# Patient Record
Sex: Female | Born: 1950 | Hispanic: Yes | Marital: Single | Smoking: Never smoker
Health system: Southern US, Community
[De-identification: ages and names within clinical notes are randomized; demographics above are authoritative.]

## PROBLEM LIST (undated history)

## (undated) DIAGNOSIS — J309 Allergic rhinitis, unspecified: Secondary | ICD-10-CM

## (undated) DIAGNOSIS — I10 Essential (primary) hypertension: Secondary | ICD-10-CM

## (undated) DIAGNOSIS — M1990 Unspecified osteoarthritis, unspecified site: Secondary | ICD-10-CM

## (undated) DIAGNOSIS — M109 Gout, unspecified: Secondary | ICD-10-CM

## (undated) DIAGNOSIS — IMO0001 Reserved for inherently not codable concepts without codable children: Secondary | ICD-10-CM

## (undated) DIAGNOSIS — E785 Hyperlipidemia, unspecified: Secondary | ICD-10-CM

## (undated) DIAGNOSIS — G4700 Insomnia, unspecified: Secondary | ICD-10-CM

## (undated) HISTORY — DX: Unspecified osteoarthritis, unspecified site: M19.90

## (undated) HISTORY — DX: Gout, unspecified: M10.9

## (undated) HISTORY — PX: ABDOMINAL HYSTERECTOMY: SHX81

## (undated) HISTORY — DX: Insomnia, unspecified: G47.00

## (undated) HISTORY — DX: Allergic rhinitis, unspecified: J30.9

## (undated) HISTORY — DX: Hyperlipidemia, unspecified: E78.5

---

## 2006-01-30 ENCOUNTER — Ambulatory Visit: Payer: Self-pay | Admitting: Internal Medicine

## 2007-09-06 ENCOUNTER — Ambulatory Visit: Payer: Self-pay | Admitting: Family Medicine

## 2008-04-01 ENCOUNTER — Ambulatory Visit: Payer: Self-pay | Admitting: Family Medicine

## 2009-09-01 ENCOUNTER — Ambulatory Visit: Payer: Self-pay

## 2011-01-23 ENCOUNTER — Ambulatory Visit: Payer: Self-pay | Admitting: Family Medicine

## 2012-02-27 ENCOUNTER — Ambulatory Visit: Payer: Self-pay | Admitting: Family Medicine

## 2013-06-02 ENCOUNTER — Ambulatory Visit: Payer: Self-pay

## 2014-02-23 DIAGNOSIS — M549 Dorsalgia, unspecified: Secondary | ICD-10-CM | POA: Insufficient documentation

## 2014-02-23 DIAGNOSIS — M1990 Unspecified osteoarthritis, unspecified site: Secondary | ICD-10-CM | POA: Insufficient documentation

## 2014-02-23 DIAGNOSIS — I10 Essential (primary) hypertension: Secondary | ICD-10-CM | POA: Insufficient documentation

## 2014-03-13 ENCOUNTER — Ambulatory Visit: Payer: Self-pay | Admitting: Orthopedic Surgery

## 2014-08-11 DIAGNOSIS — E781 Pure hyperglyceridemia: Secondary | ICD-10-CM | POA: Insufficient documentation

## 2014-08-11 DIAGNOSIS — E785 Hyperlipidemia, unspecified: Secondary | ICD-10-CM | POA: Insufficient documentation

## 2014-08-17 ENCOUNTER — Other Ambulatory Visit: Payer: Self-pay | Admitting: Family Medicine

## 2014-08-17 DIAGNOSIS — Z1231 Encounter for screening mammogram for malignant neoplasm of breast: Secondary | ICD-10-CM

## 2014-08-25 DIAGNOSIS — R7303 Prediabetes: Secondary | ICD-10-CM | POA: Insufficient documentation

## 2014-10-30 ENCOUNTER — Encounter: Payer: Self-pay | Admitting: *Deleted

## 2014-11-02 ENCOUNTER — Ambulatory Visit
Admission: RE | Admit: 2014-11-02 | Discharge: 2014-11-02 | Disposition: A | Discharge status: Home Health | Payer: Medicare Other | Source: Ambulatory Visit | Attending: Gastroenterology | Admitting: Gastroenterology

## 2014-11-02 ENCOUNTER — Ambulatory Visit: Payer: Medicare Other | Admitting: Anesthesiology

## 2014-11-02 ENCOUNTER — Encounter
Admission: RE | Disposition: A | Discharge status: Home Health | Payer: Self-pay | Source: Ambulatory Visit | Attending: Gastroenterology

## 2014-11-02 DIAGNOSIS — Z1211 Encounter for screening for malignant neoplasm of colon: Secondary | ICD-10-CM | POA: Insufficient documentation

## 2014-11-02 DIAGNOSIS — Z79899 Other long term (current) drug therapy: Secondary | ICD-10-CM | POA: Insufficient documentation

## 2014-11-02 DIAGNOSIS — Z7982 Long term (current) use of aspirin: Secondary | ICD-10-CM | POA: Insufficient documentation

## 2014-11-02 DIAGNOSIS — I10 Essential (primary) hypertension: Secondary | ICD-10-CM | POA: Diagnosis not present

## 2014-11-02 DIAGNOSIS — D120 Benign neoplasm of cecum: Secondary | ICD-10-CM | POA: Diagnosis not present

## 2014-11-02 HISTORY — PX: COLONOSCOPY WITH PROPOFOL: SHX5780

## 2014-11-02 HISTORY — DX: Essential (primary) hypertension: I10

## 2014-11-02 HISTORY — DX: Reserved for inherently not codable concepts without codable children: IMO0001

## 2014-11-02 SURGERY — COLONOSCOPY WITH PROPOFOL
Anesthesia: General

## 2014-11-02 MED ORDER — FENTANYL CITRATE (PF) 100 MCG/2ML IJ SOLN
INTRAMUSCULAR | Status: DC | PRN
  Administered 2014-11-02: 50 ug via INTRAVENOUS

## 2014-11-02 MED ORDER — SODIUM CHLORIDE 0.9 % IV SOLN
INTRAVENOUS | Status: DC
  Administered 2014-11-02: 1000 mL via INTRAVENOUS

## 2014-11-02 MED ORDER — PROPOFOL INFUSION 10 MG/ML OPTIME
INTRAVENOUS | Status: DC | PRN
  Administered 2014-11-02: 75 ug/kg/min via INTRAVENOUS

## 2014-11-02 MED ORDER — PROPOFOL 10 MG/ML IV BOLUS
INTRAVENOUS | Status: DC | PRN
  Administered 2014-11-02 (×2): 25 mg via INTRAVENOUS
  Administered 2014-11-02: 50 mg via INTRAVENOUS

## 2014-11-02 NOTE — Op Note (Signed)
[facility] Gastroenterology Patient Name: Linda Roy Procedure Date: 11/02/2014 11:22 AM MRN: [id number] Account #: [id number] Date of Birth: [birth year] Admit Type: Outpatient Age: 64 Room: [facility] ENDO ROOM 4 Gender: Female Note Status: Finalized Procedure:         Colonoscopy Indications:       Screening for colorectal malignant neoplasm Providers:         [name]. Candace Cruise, MD Referring MD:      Perrin Maltese, MD (Referring MD) Medicines:         Monitored Anesthesia Care Complications:     No immediate complications. Procedure:         Pre-Anesthesia Assessment:                    - Prior to the procedure, a History and Physical was                     performed, and patient medications, allergies and                     sensitivities were reviewed. The patient's tolerance of                     previous anesthesia was reviewed.                    - The risks and benefits of the procedure and the sedation                     options and risks were discussed with the patient. All                     questions were answered and informed consent was obtained.                    - After reviewing the risks and benefits, the patient was                     deemed in satisfactory condition to undergo the procedure.                    After obtaining informed consent, the colonoscope was                     passed under direct vision. Throughout the procedure, the                     patient's blood pressure, pulse, and oxygen saturations                     were monitored continuously. The Colonoscope was                     introduced through the anus and advanced to the the cecum,                     identified by appendiceal orifice and ileocecal valve. The                     colonoscopy was performed without difficulty. The patient                     tolerated the procedure well. The quality of the bowel  preparation was fair. Findings:    A diminutive polyp was found in the cecum. The polyp was sessile. The       polyp was removed with a jumbo cold forceps. Resection and retrieval       were complete.      The exam was otherwise without abnormality. Impression:        - One diminutive polyp in the cecum. Resected and                     retrieved.                    - The examination was otherwise normal. Recommendation:    - Discharge patient to home.                    - Await pathology results.                    - Repeat colonoscopy in 5-10 years for surveillance based                     on pathology results.                    - The findings and recommendations were discussed with the                     patient. Procedure Code(s): --- Professional ---                    [phone], Colonoscopy, flexible; with biopsy, single or                     multiple Diagnosis Code(s): --- Professional ---                    Z12.11, Encounter for screening for malignant neoplasm of                     colon                    D12.0, Benign neoplasm of cecum CPT copyright 2014 American Medical Association. All rights reserved. The codes documented in this report are preliminary and upon coder review may  be revised to meet current compliance requirements. Hulen Luster, MD 11/02/2014 12:01:47 PM This report has been signed electronically. Number of Addenda: 0 Note Initiated On: 11/02/2014 11:22 AM Scope Withdrawal Time: 0 hours 7 minutes 1 second  Total Procedure Duration: 0 hours 11 minutes 5 seconds       [facility]

## 2014-11-02 NOTE — Anesthesia Postprocedure Evaluation (Signed)
  Anesthesia Post-op Note  Patient: Linda Roy  Procedure(s) Performed: Procedure(s): COLONOSCOPY WITH PROPOFOL (N/A)  Anesthesia type:General  Patient location: PACU  Post pain: Pain level controlled  Post assessment: Post-op Vital signs reviewed, Patient's Cardiovascular Status Stable, Respiratory Function Stable, Patent Airway and No signs of Nausea or vomiting  Post vital signs: Reviewed and stable  Last Vitals:  Filed Vitals:   11/02/14 1236  BP: 140/68  Pulse: 55  Temp:   Resp: 16    Level of consciousness: awake, alert  and patient cooperative  Complications: No apparent anesthesia complications

## 2014-11-02 NOTE — H&P (Signed)
    Primary Care Physician:  Perrin Maltese, MD Primary Gastroenterologist:  Dr. Candace Cruise  Pre-Procedure History & Physical: HPI:  Linda Roy is a 64 y.o. female is here for an colonoscopy.   Past Medical History  Diagnosis Date  . Hypertension   . Shortness of breath dyspnea     No past surgical history on file.  Prior to Admission medications   Medication Sig Start Date End Date Taking? Authorizing Provider  aspirin 81 MG tablet Take 81 mg by mouth daily.   Yes Historical Provider, MD  budesonide-formoterol (SYMBICORT) 160-4.5 MCG/ACT inhaler Inhale 2 puffs into the lungs 2 (two) times daily.   Yes Historical Provider, MD  lisinopril (PRINIVIL,ZESTRIL) 10 MG tablet Take 10 mg by mouth daily.   Yes Historical Provider, MD    Allergies as of 10/06/2014  . (Not on File)    No family history on file.  History   Social History  . Marital Status: Single    Spouse Name: N/A  . Number of Children: N/A  . Years of Education: N/A   Occupational History  . Not on file.   Social History Main Topics  . Smoking status: Not on file  . Smokeless tobacco: Not on file  . Alcohol Use: Not on file  . Drug Use: Not on file  . Sexual Activity: Not on file   Other Topics Concern  . Not on file   Social History Narrative  . No narrative on file    Review of Systems: See HPI, otherwise negative ROS  Physical Exam: There were no vitals taken for this visit. General:   Alert,  pleasant and cooperative in NAD Head:  Normocephalic and atraumatic. Neck:  Supple; no masses or thyromegaly. Lungs:  Clear throughout to auscultation.    Heart:  Regular rate and rhythm. Abdomen:  Soft, nontender and nondistended. Normal bowel sounds, without guarding, and without rebound.   Neurologic:  Alert and  oriented x4;  grossly normal neurologically.  Impression/Plan: Linda Roy is here for an colonoscopy to be performed for screening.  Risks, benefits, limitations, and  alternatives regarding colonscopy have been reviewed with the patient.  Questions have been answered.  All parties agreeable.   Linda Roy, [name], MD  11/02/2014, 11:04 AM

## 2014-11-02 NOTE — Anesthesia Preprocedure Evaluation (Signed)
Anesthesia Evaluation  Patient identified by MRN, date of birth, ID band Patient awake    Reviewed: Allergy & Precautions, H&P , NPO status , Patient's Chart, lab work & pertinent test results, reviewed documented beta blocker date and time   Airway Mallampati: II  TM Distance: >3 FB Neck ROM: full    Dental no notable dental hx. (+) Teeth Intact   Pulmonary shortness of breath,  breath sounds clear to auscultation  Pulmonary exam normal       Cardiovascular hypertension, Normal cardiovascular examRhythm:regular Rate:Normal     Neuro/Psych negative neurological ROS  negative psych ROS   GI/Hepatic negative GI ROS, Neg liver ROS,   Endo/Other  negative endocrine ROS  Renal/GU negative Renal ROS  negative genitourinary   Musculoskeletal   Abdominal   Peds  Hematology negative hematology ROS (+)   Anesthesia Other Findings Past Medical History:   Hypertension                                                 Shortness of breath dyspnea                                  Reproductive/Obstetrics negative OB ROS                             Anesthesia Physical Anesthesia Plan  ASA: III  Anesthesia Plan: General   Post-op Pain Management:    Induction:   Airway Management Planned:   Additional Equipment:   Intra-op Plan:   Post-operative Plan:   Informed Consent: I have reviewed the patients History and Physical, chart, labs and discussed the procedure including the risks, benefits and alternatives for the proposed anesthesia with the patient or authorized representative who has indicated his/her understanding and acceptance.     Plan Discussed with: Anesthesiologist, CRNA and Surgeon  Anesthesia Plan Comments:         Anesthesia Quick Evaluation

## 2014-11-02 NOTE — Transfer of Care (Signed)
Immediate Anesthesia Transfer of Care Note  Patient: Linda Roy  Procedure(s) Performed: Procedure(s): COLONOSCOPY WITH PROPOFOL (N/A)  Patient Location: PACU and Endoscopy Unit  Anesthesia Type:General  Level of Consciousness: sedated  Airway & Oxygen Therapy: Patient Spontanous Breathing  Post-op Assessment: Report given to RN  Post vital signs: stable  Last Vitals:  Filed Vitals:   11/02/14 1109  BP: 145/64  Pulse: 58  Temp: 36.6 C  Resp: 15    Complications: No apparent anesthesia complications

## 2014-11-04 LAB — SURGICAL PATHOLOGY

## 2014-11-09 ENCOUNTER — Encounter: Payer: Self-pay | Admitting: Gastroenterology

## 2015-01-04 ENCOUNTER — Ambulatory Visit
Admission: RE | Admit: 2015-01-04 | Discharge: 2015-01-04 | Disposition: A | Discharge status: Home / Self Care | Payer: Medicare Other | Source: Ambulatory Visit | Attending: Family Medicine | Admitting: Family Medicine

## 2015-01-04 DIAGNOSIS — Z1231 Encounter for screening mammogram for malignant neoplasm of breast: Secondary | ICD-10-CM | POA: Diagnosis not present

## 2016-03-01 ENCOUNTER — Other Ambulatory Visit: Payer: Self-pay | Admitting: Internal Medicine

## 2016-03-01 DIAGNOSIS — Z1231 Encounter for screening mammogram for malignant neoplasm of breast: Secondary | ICD-10-CM

## 2016-04-12 ENCOUNTER — Ambulatory Visit
Admission: RE | Admit: 2016-04-12 | Discharge: 2016-04-12 | Disposition: A | Discharge status: Home / Self Care | Payer: Medicare Other | Source: Ambulatory Visit | Attending: Internal Medicine | Admitting: Internal Medicine

## 2016-04-12 DIAGNOSIS — Z1231 Encounter for screening mammogram for malignant neoplasm of breast: Secondary | ICD-10-CM | POA: Diagnosis not present

## 2016-04-12 DIAGNOSIS — R928 Other abnormal and inconclusive findings on diagnostic imaging of breast: Secondary | ICD-10-CM | POA: Diagnosis not present

## 2016-04-18 ENCOUNTER — Other Ambulatory Visit: Payer: Self-pay | Admitting: Internal Medicine

## 2016-04-18 DIAGNOSIS — N6310 Unspecified lump in the right breast, unspecified quadrant: Secondary | ICD-10-CM

## 2016-04-18 DIAGNOSIS — R928 Other abnormal and inconclusive findings on diagnostic imaging of breast: Secondary | ICD-10-CM

## 2016-05-12 ENCOUNTER — Ambulatory Visit
Admission: RE | Admit: 2016-05-12 | Discharge: 2016-05-12 | Disposition: A | Discharge status: Home / Self Care | Payer: Medicare Other | Source: Ambulatory Visit | Attending: Internal Medicine | Admitting: Internal Medicine

## 2016-05-12 DIAGNOSIS — N6310 Unspecified lump in the right breast, unspecified quadrant: Secondary | ICD-10-CM | POA: Insufficient documentation

## 2016-05-12 DIAGNOSIS — R928 Other abnormal and inconclusive findings on diagnostic imaging of breast: Secondary | ICD-10-CM | POA: Insufficient documentation

## 2017-06-17 IMAGING — MG MM DIGITAL DIAGNOSTIC UNILAT*R* W/ TOMO W/ CAD
6 series · 6 of 14 positions shown · non-contrast
Comparison: Previous exam(s).

CLINICAL DATA: Screening recall for possible right breast masses.

EXAM:
2D DIGITAL DIAGNOSTIC UNILATERAL RIGHT MAMMOGRAM WITH CAD AND
ADJUNCT TOMO
RIGHT BREAST ULTRASOUND

[R CC synth-2D]
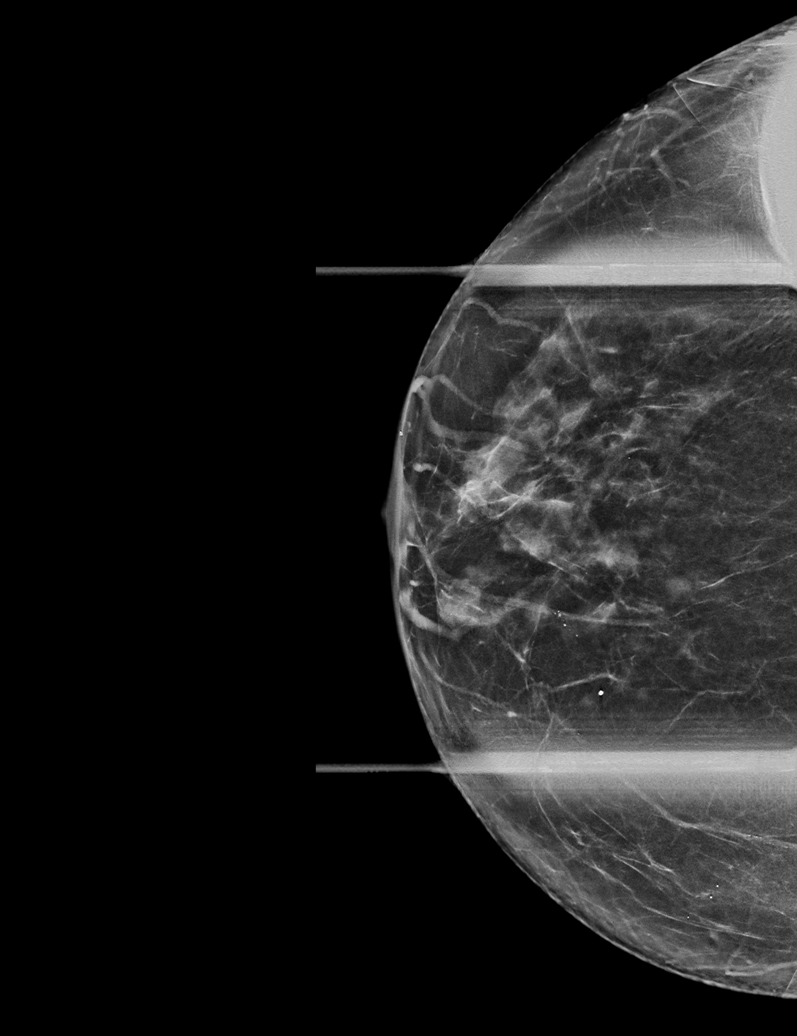

[R MLO synth-2D]
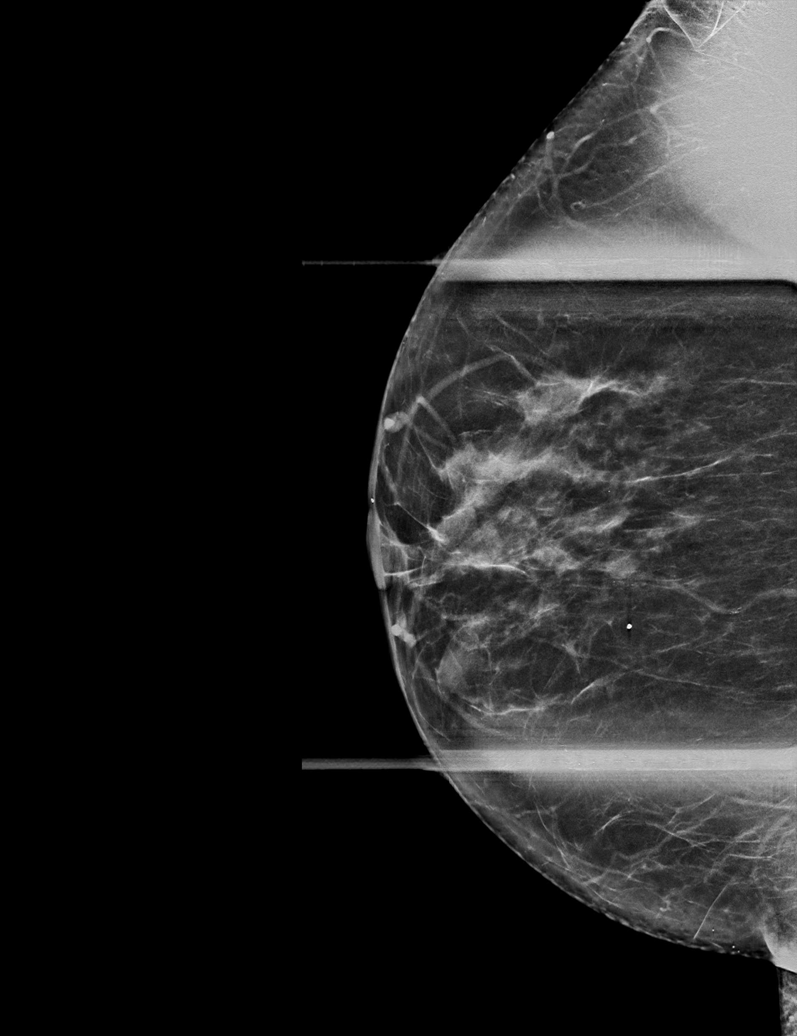

[R MLO]
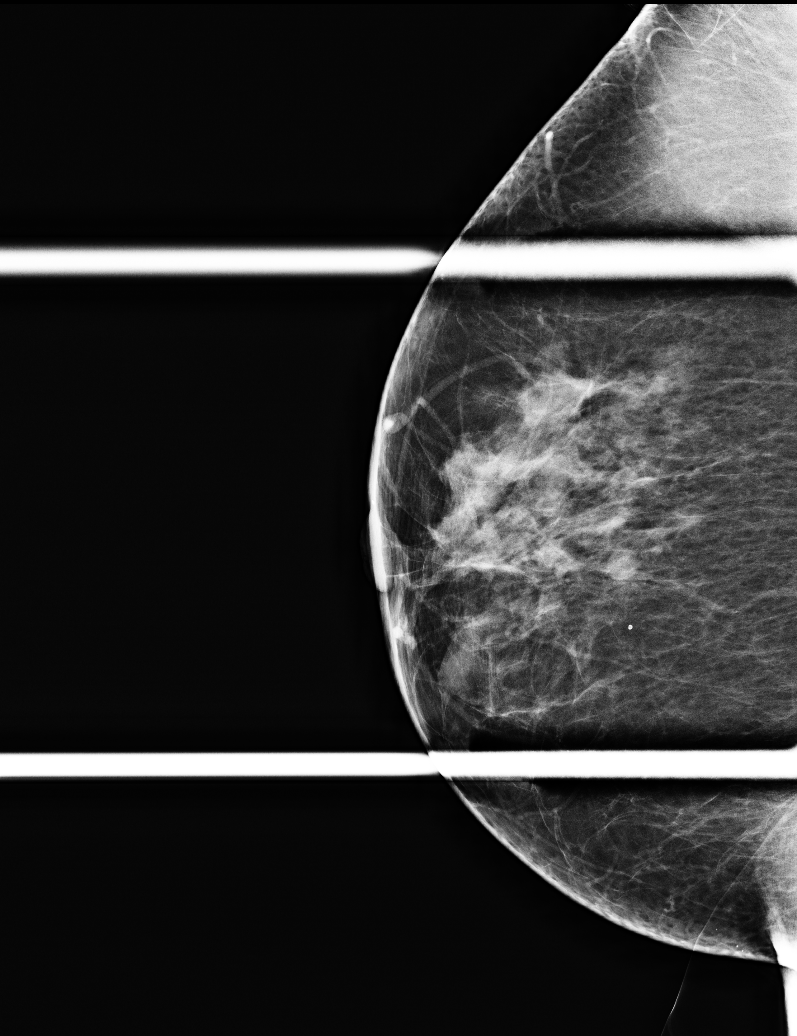

[R CC]
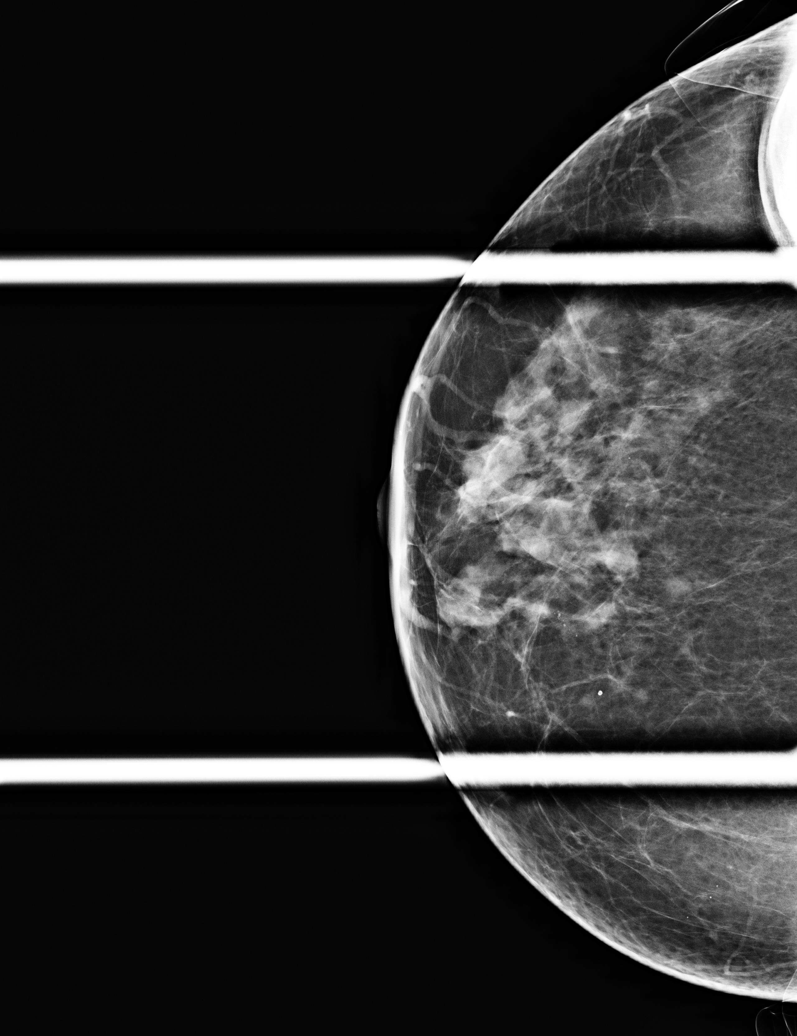

[R MLO tomo · tomo slice 33/66.0]
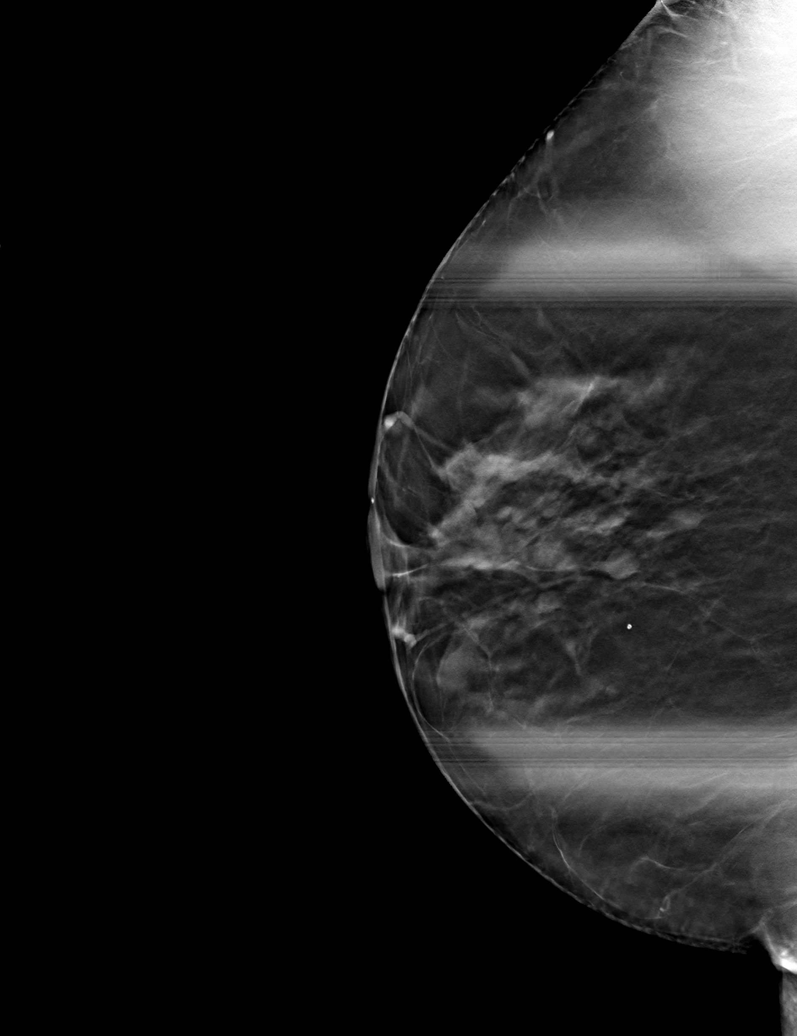

[R CC tomo · tomo slice 31/62.0]
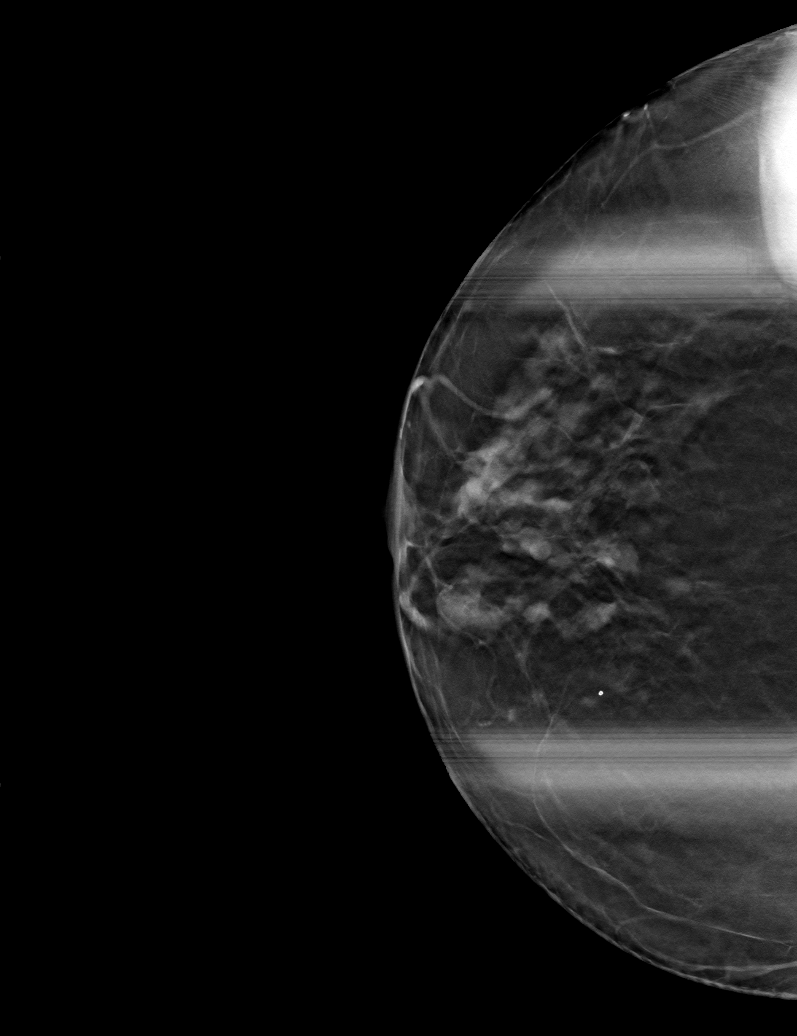

[6 of 14 positions shown; findings below may reference images not displayed]

ACR Breast Density Category b: There are scattered areas of
fibroglandular density.
FINDINGS: Multiple subcentimeter nodular masses are seen in the central
superior right breast.

Mammographic images were processed with CAD.

Ultrasound of the superior and central right breast demonstrates
multiple anechoic circumscribed oval masses compatible with benign
cysts. Representative images were acquired. No suspicious masses or
areas of shadowing are identified.
IMPRESSION: The subareolar mass in the right breast are compatible with benign
cysts.

RECOMMENDATION:
Screening mammogram in one year.(Code:[id number]-[id number])

I have discussed the findings and recommendations with the patient.
Results were also provided in writing at the conclusion of the
visit. If applicable, a reminder letter will be sent to the patient
regarding the next appointment.

BI-RADS CATEGORY  2: Benign.

## 2017-06-29 ENCOUNTER — Other Ambulatory Visit: Payer: Self-pay | Admitting: Internal Medicine

## 2017-06-29 DIAGNOSIS — Z1231 Encounter for screening mammogram for malignant neoplasm of breast: Secondary | ICD-10-CM

## 2017-07-18 ENCOUNTER — Ambulatory Visit
Admission: RE | Admit: 2017-07-18 | Discharge: 2017-07-18 | Disposition: A | Discharge status: Home / Self Care | Payer: Medicare Other | Source: Ambulatory Visit | Attending: Internal Medicine | Admitting: Internal Medicine

## 2017-07-18 DIAGNOSIS — Z1231 Encounter for screening mammogram for malignant neoplasm of breast: Secondary | ICD-10-CM | POA: Insufficient documentation

## 2018-01-03 DIAGNOSIS — I379 Nonrheumatic pulmonary valve disorder, unspecified: Secondary | ICD-10-CM | POA: Insufficient documentation

## 2018-05-16 ENCOUNTER — Other Ambulatory Visit: Payer: Self-pay | Admitting: Family

## 2018-05-16 DIAGNOSIS — N63 Unspecified lump in unspecified breast: Secondary | ICD-10-CM

## 2018-05-16 DIAGNOSIS — N644 Mastodynia: Secondary | ICD-10-CM

## 2018-05-21 ENCOUNTER — Other Ambulatory Visit: Payer: Medicare Other

## 2018-05-24 ENCOUNTER — Other Ambulatory Visit: Payer: Medicare Other

## 2019-04-07 ENCOUNTER — Other Ambulatory Visit: Payer: Self-pay | Admitting: Internal Medicine

## 2019-04-07 DIAGNOSIS — N6310 Unspecified lump in the right breast, unspecified quadrant: Secondary | ICD-10-CM

## 2019-04-07 DIAGNOSIS — N63 Unspecified lump in unspecified breast: Secondary | ICD-10-CM

## 2019-04-16 ENCOUNTER — Ambulatory Visit
Admission: RE | Admit: 2019-04-16 | Discharge: 2019-04-16 | Disposition: A | Discharge status: Home / Self Care | Payer: Medicare Other | Source: Ambulatory Visit | Attending: Internal Medicine | Admitting: Internal Medicine

## 2019-04-16 DIAGNOSIS — N6310 Unspecified lump in the right breast, unspecified quadrant: Secondary | ICD-10-CM | POA: Diagnosis present

## 2019-04-16 DIAGNOSIS — N6011 Diffuse cystic mastopathy of right breast: Secondary | ICD-10-CM | POA: Diagnosis not present

## 2019-04-16 DIAGNOSIS — N6012 Diffuse cystic mastopathy of left breast: Secondary | ICD-10-CM | POA: Insufficient documentation

## 2019-07-25 ENCOUNTER — Other Ambulatory Visit: Payer: Self-pay

## 2019-07-25 ENCOUNTER — Ambulatory Visit: Payer: Medicare Other | Attending: Internal Medicine

## 2019-07-25 DIAGNOSIS — Z23 Encounter for immunization: Secondary | ICD-10-CM

## 2019-07-25 NOTE — Progress Notes (Signed)
   Covid-19 Vaccination Clinic  Name:  Linda Roy    MRN: [id number] DOB: [birth year]  07/25/2019  Ms. Linda Roy was observed post Covid-19 immunization for 15 minutes without incident. She was provided with Vaccine Information Sheet and instruction to access the V-Safe system.   Ms. Linda Roy was instructed to call 911 with any severe reactions post vaccine: Marland Kitchen Difficulty breathing  . Swelling of face and throat  . A fast heartbeat  . A bad rash all over body  . Dizziness and weakness   Immunizations Administered    Name Date Dose VIS Date Route   Pfizer COVID-19 Vaccine 07/25/2019 11:47 AM 0.3 mL 03/28/2019 Intramuscular   Manufacturer: [place]   Lot: [id number]   [place]: [id number]

## 2020-03-01 ENCOUNTER — Other Ambulatory Visit: Payer: Self-pay | Admitting: Internal Medicine

## 2020-03-01 DIAGNOSIS — M7121 Synovial cyst of popliteal space [Baker], right knee: Secondary | ICD-10-CM

## 2020-03-03 ENCOUNTER — Telehealth: Payer: Self-pay

## 2020-03-04 ENCOUNTER — Telehealth: Payer: Self-pay

## 2020-04-15 ENCOUNTER — Other Ambulatory Visit: Payer: Self-pay | Admitting: Family

## 2020-04-15 DIAGNOSIS — Z1231 Encounter for screening mammogram for malignant neoplasm of breast: Secondary | ICD-10-CM

## 2020-05-14 ENCOUNTER — Ambulatory Visit
Admission: RE | Admit: 2020-05-14 | Discharge: 2020-05-14 | Disposition: A | Discharge status: Home / Self Care | Payer: Medicare Other | Source: Ambulatory Visit | Attending: Family | Admitting: Family

## 2020-05-14 ENCOUNTER — Other Ambulatory Visit: Payer: Self-pay

## 2020-05-14 DIAGNOSIS — Z1231 Encounter for screening mammogram for malignant neoplasm of breast: Secondary | ICD-10-CM | POA: Insufficient documentation

## 2020-09-20 ENCOUNTER — Other Ambulatory Visit: Payer: Self-pay | Admitting: Internal Medicine

## 2020-09-20 DIAGNOSIS — K8020 Calculus of gallbladder without cholecystitis without obstruction: Secondary | ICD-10-CM

## 2020-09-22 ENCOUNTER — Other Ambulatory Visit: Payer: Self-pay

## 2020-09-22 ENCOUNTER — Ambulatory Visit (HOSPITAL_BASED_OUTPATIENT_CLINIC_OR_DEPARTMENT_OTHER)
Admission: RE | Admit: 2020-09-22 | Discharge: 2020-09-22 | Disposition: A | Discharge status: Home / Self Care | Payer: Medicare Other | Source: Ambulatory Visit | Attending: Internal Medicine | Admitting: Internal Medicine

## 2020-09-22 DIAGNOSIS — K8020 Calculus of gallbladder without cholecystitis without obstruction: Secondary | ICD-10-CM | POA: Insufficient documentation

## 2020-09-28 ENCOUNTER — Telehealth: Payer: Self-pay

## 2020-09-28 NOTE — Telephone Encounter (Signed)
LVM for pt to call the office to schedule an appt with Dr. Hampton Abbot for Cholelithiasis.

## 2020-10-01 ENCOUNTER — Ambulatory Visit: Payer: Medicare Other | Admitting: Surgery

## 2020-10-04 ENCOUNTER — Other Ambulatory Visit: Payer: Self-pay

## 2020-10-04 ENCOUNTER — Telehealth: Payer: Self-pay

## 2020-10-04 ENCOUNTER — Encounter: Payer: Self-pay | Admitting: Surgery

## 2020-10-04 ENCOUNTER — Ambulatory Visit (INDEPENDENT_AMBULATORY_CARE_PROVIDER_SITE_OTHER): Payer: Medicare Other | Admitting: Surgery

## 2020-10-04 VITALS — BP 178/93 | HR 78 | Temp 98.5°F | Ht 60.0 in | Wt 150.4 lb

## 2020-10-04 DIAGNOSIS — K8070 Calculus of gallbladder and bile duct without cholecystitis without obstruction: Secondary | ICD-10-CM | POA: Diagnosis not present

## 2020-10-04 DIAGNOSIS — K8020 Calculus of gallbladder without cholecystitis without obstruction: Secondary | ICD-10-CM

## 2020-10-04 NOTE — Telephone Encounter (Signed)
Faxed medical clearance to Dr. Lamonte Sakai at [phone].

## 2020-10-04 NOTE — Progress Notes (Signed)
10/04/2020  Reason for Visit:  Symptomatic cholelithiasis  Referring Provider:  Lamonte Sakai, MD  History of Present Illness: Linda Roy is a 70 y.o. female presenting for evaluation of cholelithiasis.  She reports having an episode of biliary colic that was very significant in March while she was abroad in [place].  She went to a clinic and was diagnosed with gallstones and surgery was recommended at that time but she declined.  She was given something for pain which relieved her pain.  Since then, she reports that she has had some minor discomfort in the right upper quadrant area at times but has not had any significant episodes like the one in March.  The main symptom that she had was pain but no significant nausea or vomiting.  Denies having any fevers, chills, chest pain, shortness of breath.  The pain episode started after eating.  Denies any constipation or diarrhea.  Once back from her trip, she had a repeat ultrasound on 09/22/2020, which shows cholelithiasis with hepatic steatosis and a left kidney cyst.  LFTs were normal on 09/16/2020.  Past Medical History: Past Medical History:  Diagnosis Date   Allergic rhinitis    Arthritis    lumbar back mild degenerative changes   Gout    Hyperlipidemia    Hypertension    Insomnia    Shortness of breath dyspnea      Past Surgical History: Past Surgical History:  Procedure Laterality Date   ABDOMINAL HYSTERECTOMY     COLONOSCOPY WITH PROPOFOL N/A 11/02/2014   Procedure: COLONOSCOPY WITH PROPOFOL;  Surgeon: Hulen Luster, MD;  Location: [facility] ENDOSCOPY;  Service: Gastroenterology;  Laterality: N/A;    Home Medications: Prior to Admission medications   Medication Sig Start Date End Date Taking? Authorizing Provider  lisinopril (PRINIVIL,ZESTRIL) 10 MG tablet Take 10 mg by mouth daily.   Yes [provider]    Allergies: Not on File  Social History:  reports that she has never smoked. She has never used smokeless  tobacco. No history on file for alcohol use and drug use.   Family History: History reviewed. No pertinent family history.  Review of Systems: Review of Systems  Constitutional:  Negative for chills and fever.  HENT:  Negative for hearing loss.   Respiratory:  Negative for shortness of breath.   Cardiovascular:  Negative for chest pain.  Gastrointestinal:  Positive for abdominal pain. Negative for constipation, diarrhea, nausea and vomiting.  Genitourinary:  Negative for dysuria.  Musculoskeletal:  Negative for myalgias.  Skin:  Negative for rash.  Neurological:  Negative for dizziness.  Psychiatric/Behavioral:  Negative for depression.    Physical Exam BP (!) 178/93   Pulse 78   Temp 98.5 F (36.9 C) (Oral)   Ht 5' (1.524 m)   Wt 150 lb 6.4 oz (68.2 kg)   SpO2 90%   BMI 29.37 kg/m  CONSTITUTIONAL: No acute distress, well-nourished HEENT:  Normocephalic, atraumatic, extraocular motion intact. NECK: Trachea is midline, and there is no jugular venous distension.  RESPIRATORY:  Lungs are clear, and breath sounds are equal bilaterally. Normal respiratory effort without pathologic use of accessory muscles. CARDIOVASCULAR: Heart is regular without murmurs, gallops, or rubs. GI: The abdomen is soft, nondistended, with minimal discomfort in the right upper quadrant and epigastric area.  Negative Murphy sign.  MUSCULOSKELETAL:  Normal muscle strength and tone in all four extremities.  No peripheral edema or cyanosis. SKIN: Skin turgor is normal. There are no pathologic skin lesions.  NEUROLOGIC:  Motor and sensation is grossly normal.  Cranial nerves are grossly intact. PSYCH:  Alert and oriented to person, place and time. Affect is normal.  Laboratory Analysis: Labs from 09/16/2020: Sodium 144, potassium 4.8, chloride 107, carbon dioxide 20, BUN 11, creatinine 0.79.  Total bilirubin 0.5, AST 23, ALT 17, alkaline phosphatase 89.  WBC 9.2, hemoglobin 13.1, hematocrit 38.4, platelets  255.  Imaging: Ultrasound abdomen on 09/22/2020: IMPRESSION: 1. Gallbladder is contracted and filled with calculi. No appreciable gallbladder wall thickening or pericholecystic fluid.   2. Increased liver echogenicity is consistent with hepatic steatosis. No focal liver lesions are evident. Note that the sensitivity of ultrasound for detection of focal liver lesions is diminished in this circumstance.   3. Mild fullness of the right collecting system without obstructing focus evident by ultrasound.   4.  Cyst mid left kidney measuring 1.5 x 1.3 x 1.6 cm.   5.  Aortic Atherosclerosis (ICD10-I70.0).    Assessment and Plan: This is a 70 y.o. female with symptomatic cholelithiasis. - Discussed with the patient the role of the gallbladder and how on her ultrasound she had cholelithiasis and how that contributes to the episodes of biliary colic.  At this time, the patient is not having any significant symptoms and she would like to avoid surgery if possible.  With that, I think trying a trial of low-fat diet is reasonable over the next few months to see how she does.  Discussed with her the reason behind low-fat diet and how that could contribute or help with her symptoms.  However if her symptoms continue or there is any worsening, she should contact us so we can see her again and discuss further about surgery.  Patient understands this plan and she is willing to proceed.   --Also encouraged her to continue her antacid regimen and to avoid coffee as she mentioned that this causes her heartburn. - Follow-up as needed.  Face-to-face time spent with the patient and care providers was 60 minutes, with more than 50% of the time spent counseling, educating, and coordinating care of the patient.     Melvyn Neth, [facility]

## 2020-10-04 NOTE — Patient Instructions (Addendum)
If you have any concerns or questions, please feel free to call our office.    Plan de alimentacin para problemas de vescula biliar Gallbladder Eating Plan Si tiene una afeccin de la vescula biliar, puede tener problemas para digerir las grasas. Consumir una dieta con bajo contenido de grasas puede [employer] sntomas, y puede ser beneficiosa antes y despus de [place] de extraccin de vescula biliar (colecistectoma). El mdico puede recomendarle que trabaje con un especialista en dietas y alimentacin (nutricionista) para que lo ayude a reducir la cantidad de grasas en su dieta. Consejos para seguir este plan [place] generales Limite el consumo de grasas a menos del 30 % del total de caloras diarias. Si usted ingiere alrededor de 1800 caloras diarias, esto es menos de 60 gramos (g) de Materials engineer. La grasa es una parte importante de una dieta saludable. Consumir una dieta con bajo contenido de grasas puede dificultar mantener un peso corporal saludable. Pregunte a su nutricionista qu cantidad de grasas, caloras y otros nutrientes necesita diariamente. Haga comidas pequeas y frecuentes Medical sales representative de tres comidas abundantes. Beba de 8 a 10 vasos de lquido por [employer]. Beba suficiente lquido como para mantener la orina clara o de color amarillo plido. Limite el consumo de alcohol a no ms de 1 medida por da si es mujer y no est Music therapist, y a 2 medidas por da si es hombre. Una medida equivale a 12 oz de cerveza, 5 oz de vino o 1 oz de bebidas alcohlicas de alta graduacin. Al leer las etiquetas de los alimentos  Consulte la informacin nutricional en las etiquetas de los alimentos para conocer la cantidad de grasas por porcin. Elija alimentos con menos de 3 gramos de grasas por porcin.  Al ir de compras Elija alimentos saludables sin grasas o con bajo contenido de grasas. Busque las palabras "sin grasa", "bajo en grasas" o "con bajo contenido de  grasas". Evite comprar alimentos procesados o preenvasados. Al cocinar Para cocinar opte por mtodos con bajo contenido de grasa, como hornear, hervir, Interior and spatial designer y Holiday representative. Cocine con pequeas cantidades de grasas saludables, como aceite de [place], aceite de semilla de [place], aceite de canola o [place]. Qu alimentos se recomiendan? [place] y verduras frescas, congeladas o enlatadas. Cereales integrales. Leche y yogur semidescremados y descremados. Lysbeth Galas, aves sin piel, pescado, huevos y legumbres. Suplementos proteicos con bajo contenido de grasas, en polvo o lquidos. Hierbas y especias. Qu alimentos no se recomiendan? Alimentos muy grasos. Entre estos se incluyen productos panificados, comida rpida, cortes de carne con grasa, helados, pan francs, rosquillas dulces, pizza, pan de queso, alimentos cubiertos con [place], salsas con crema o queso. Comidas fritas. Se incluyen papas fritas, tempura, pescado rebozado, milanesas de pollo, panes fritos y dulces. Alimentos con [employer]. Alimentos que causan gases o meteorismo. Resumen Una dieta de bajo contenido graso puede ser beneficiosa si tiene una afeccin de la vescula biliar o puede hacerla antes y despus de someterse a una ciruga de vescula. Limite el consumo de grasas a menos del 30 % del total de caloras diarias. Esto es casi 60 gramos de grasa si usted ingiere 1800 caloras diarias. Haga comidas pequeas y frecuentes Medical sales representative de tres comidas abundantes. Esta informacin no tiene Marine scientist el consejo del mdico. Asegresede hacerle al mdico cualquier pregunta que tenga. Document Revised: 01/28/2020 Document Reviewed: 01/28/2020 Elsevier Patient Education  2022 [employer].

## 2020-10-04 NOTE — Telephone Encounter (Signed)
No medical clearance needed at this time.

## 2020-11-07 ENCOUNTER — Emergency Department
Admission: EM | Admit: 2020-11-07 | Discharge: 2020-11-07 | Discharge status: Against Medical Advice | Payer: Medicare Other

## 2020-11-07 NOTE — ED Notes (Signed)
Daughter states they are leaving and going to [place] due to visiting restrictions in lobby.

## 2021-08-09 ENCOUNTER — Other Ambulatory Visit: Payer: Self-pay | Admitting: Internal Medicine

## 2021-08-09 DIAGNOSIS — Z1231 Encounter for screening mammogram for malignant neoplasm of breast: Secondary | ICD-10-CM

## 2021-09-15 ENCOUNTER — Ambulatory Visit
Admission: RE | Admit: 2021-09-15 | Discharge: 2021-09-15 | Disposition: A | Discharge status: Home / Self Care | Payer: Medicare Other | Source: Ambulatory Visit | Attending: Internal Medicine | Admitting: Internal Medicine

## 2021-09-15 DIAGNOSIS — Z1231 Encounter for screening mammogram for malignant neoplasm of breast: Secondary | ICD-10-CM | POA: Diagnosis present

## 2022-05-29 ENCOUNTER — Ambulatory Visit: Payer: Medicare Other | Admitting: Internal Medicine

## 2022-06-19 ENCOUNTER — Ambulatory Visit: Payer: Medicare Other | Admitting: Internal Medicine

## 2022-06-22 ENCOUNTER — Ambulatory Visit (INDEPENDENT_AMBULATORY_CARE_PROVIDER_SITE_OTHER): Payer: Medicare Other | Admitting: Internal Medicine

## 2022-06-22 ENCOUNTER — Ambulatory Visit: Payer: Medicare Other | Admitting: Internal Medicine

## 2022-06-22 ENCOUNTER — Encounter: Payer: Self-pay | Admitting: Internal Medicine

## 2022-06-22 VITALS — BP 124/64 | HR 71 | Ht 60.0 in | Wt 146.8 lb

## 2022-06-22 DIAGNOSIS — E782 Mixed hyperlipidemia: Secondary | ICD-10-CM | POA: Diagnosis not present

## 2022-06-22 DIAGNOSIS — R7302 Impaired glucose tolerance (oral): Secondary | ICD-10-CM

## 2022-06-22 DIAGNOSIS — J301 Allergic rhinitis due to pollen: Secondary | ICD-10-CM

## 2022-06-22 DIAGNOSIS — I10 Essential (primary) hypertension: Secondary | ICD-10-CM | POA: Diagnosis not present

## 2022-06-22 DIAGNOSIS — M1A09X0 Idiopathic chronic gout, multiple sites, without tophus (tophi): Secondary | ICD-10-CM | POA: Insufficient documentation

## 2022-06-22 DIAGNOSIS — M1A9XX0 Chronic gout, unspecified, without tophus (tophi): Secondary | ICD-10-CM

## 2022-06-22 MED ORDER — COLCHICINE 0.6 MG PO TABS: 0.6000 mg | ORAL_TABLET | Freq: Two times a day (BID) | ORAL | 2 refills | Status: DC

## 2022-06-22 NOTE — Progress Notes (Signed)
Established Patient Office Visit  Subjective:  Patient ID: Linda Roy, female    DOB: 1950/04/20  Age: 72 y.o. MRN: [id number]  Chief Complaint  Patient presents with   Follow-up    Follow up    Patient comes in for a follow-up today.  She has not been in since April 2023.  Reports that she has been traveling and was doing well.  However she had a flareup of gout and her Mitigare tablets do not help her as much as Colchicine tablets that she borrowed from her brother.  She is also taking Allopurinol. Patient complains of fullness and pressure in both her ears.  She is having some difficulty hearing.  On exam there is fluid behind both the tympanic membranes.  There is no redness or swelling.  There are chunks of dried brown wax in the left ear canal. Patient advised to resume her Flonase nasal spray and Zyrtec.  She was also advised to start using Debrox eardrops in the left ear canal which will help with wax removal.     Past Medical History:  Diagnosis Date   Allergic rhinitis    Arthritis    lumbar back mild degenerative changes   Gout    Hyperlipidemia    Hypertension    Insomnia    Shortness of breath dyspnea     Past Surgical History:  Procedure Laterality Date   ABDOMINAL HYSTERECTOMY     COLONOSCOPY WITH PROPOFOL N/A 11/02/2014   Procedure: COLONOSCOPY WITH PROPOFOL;  Surgeon: Hulen Luster, MD;  Location: [facility] ENDOSCOPY;  Service: Gastroenterology;  Laterality: N/A;    Social History   Socioeconomic History   Marital status: Single    Spouse name: Not on file   Number of children: Not on file   Years of education: Not on file   Highest education level: Not on file  Occupational History   Not on file  Tobacco Use   Smoking status: Never   Smokeless tobacco: Never  Substance and Sexual Activity   Alcohol use: Not on file   Drug use: Not on file   Sexual activity: Not on file  Other Topics Concern   Not on file  Social History Narrative   Not on  file   Social Determinants of Health   Financial Resource Strain: Not on file  Food Insecurity: Not on file  Transportation Needs: Not on file  Physical Activity: Not on file  Stress: Not on file  Social Connections: Not on file  Intimate Partner Violence: Not on file    Family History  Problem Relation Age of Onset   Breast cancer Neg Hx     Allergies  Allergen Reactions   Penicillins Nausea Only and Shortness Of Breath    Other reaction(s): Nausea, Vomiting, Shortness of Breath / Dyspnea   Lisinopril     Note: cough    Review of Systems  Constitutional: Negative.   HENT:  Positive for congestion and hearing loss.   Eyes: Negative.   Respiratory: Negative.    Cardiovascular: Negative.   Gastrointestinal: Negative.   Genitourinary: Negative.   Musculoskeletal:  Positive for joint pain. Negative for falls, myalgias and neck pain.  Skin: Negative.   Neurological: Negative.   Psychiatric/Behavioral: Negative.         Objective:   BP 124/64   Pulse 71   Ht 5' (1.524 m)   Wt 146 lb 12.8 oz (66.6 kg)   SpO2 98%   BMI 28.67 kg/m  Vitals:   06/22/22 1349  BP: 124/64  Pulse: 71  Height: 5' (1.524 m)  Weight: 146 lb 12.8 oz (66.6 kg)  SpO2: 98%  BMI (Calculated): 28.67    Physical Exam Vitals and nursing note reviewed.  Constitutional:      Appearance: Normal appearance.  HENT:     Right Ear: A middle ear effusion is present. Tympanic membrane is not injected or scarred.     Left Ear: A middle ear effusion is present. There is impacted cerumen. Tympanic membrane is not injected or scarred.  Cardiovascular:     Rate and Rhythm: Normal rate and regular rhythm.  Pulmonary:     Effort: Pulmonary effort is normal.     Breath sounds: Normal breath sounds.  Abdominal:     General: Abdomen is flat. Bowel sounds are normal.     Palpations: Abdomen is soft.  Musculoskeletal:        General: Normal range of motion.     Cervical back: Normal range of motion  and neck supple.  Skin:    General: Skin is warm.  Neurological:     General: No focal deficit present.     Mental Status: She is alert and oriented to person, place, and time.  Psychiatric:        Mood and Affect: Mood normal.        Behavior: Behavior normal.      No results found for any visits on 06/22/22.  No results found for this or any previous visit (from the past 2160 hour(s)).    Assessment & Plan:  Patient to get blood work today.  She will resume her Flonase nasal spray as well as Zyrtec.  Prescription for colchicine sent to her pharmacy.  She will also use Debrox eardrops in her left ear canal. Problem List Items Addressed This Visit     Hyperlipidemia   Relevant Medications   amLODipine (NORVASC) 5 MG tablet   fenofibrate (TRICOR) 145 MG tablet   Other Relevant Orders   Lipid Panel w/o Chol/HDL Ratio   Essential hypertension, benign   Relevant Medications   amLODipine (NORVASC) 5 MG tablet   fenofibrate (TRICOR) 145 MG tablet   Other Relevant Orders   CMP14+EGFR   Chronic gout involving toe of left foot without tophus   Relevant Medications   baclofen (LIORESAL) 10 MG tablet   meloxicam (MOBIC) 7.5 MG tablet   colchicine 0.6 MG tablet   Other Relevant Orders   Uric acid   Idiopathic chronic gout of multiple sites without tophus - Primary   Relevant Medications   colchicine 0.6 MG tablet   Impaired glucose tolerance   Relevant Orders   Hemoglobin A1c    Return in about 3 months (around 09/22/2022).   Total time spent: 30 minutes  Perrin Maltese, MD  06/22/2022

## 2022-06-23 ENCOUNTER — Other Ambulatory Visit: Payer: Self-pay | Admitting: Internal Medicine

## 2022-06-23 DIAGNOSIS — M1A09X0 Idiopathic chronic gout, multiple sites, without tophus (tophi): Secondary | ICD-10-CM

## 2022-06-23 DIAGNOSIS — E781 Pure hyperglyceridemia: Secondary | ICD-10-CM

## 2022-06-23 LAB — CMP14+EGFR
ALT: 18 IU/L (ref 0–32)
AST: 20 IU/L (ref 0–40)
Albumin/Globulin Ratio: 2 (ref 1.2–2.2)
Albumin: 4.6 g/dL (ref 3.8–4.8)
Alkaline Phosphatase: 120 IU/L (ref 44–121)
BUN/Creatinine Ratio: 16 (ref 12–28)
BUN: 17 mg/dL (ref 8–27)
Bilirubin Total: 0.3 mg/dL (ref 0.0–1.2)
CO2: 25 mmol/L (ref 20–29)
Calcium: 9.9 mg/dL (ref 8.7–10.3)
Chloride: 103 mmol/L (ref 96–106)
Creatinine, Ser: 1.04 mg/dL — ABNORMAL HIGH (ref 0.57–1.00)
Globulin, Total: 2.3 g/dL (ref 1.5–4.5)
Glucose: 104 mg/dL — ABNORMAL HIGH (ref 70–99)
Potassium: 4.5 mmol/L (ref 3.5–5.2)
Sodium: 143 mmol/L (ref 134–144)
Total Protein: 6.9 g/dL (ref 6.0–8.5)
eGFR: 57 mL/min/{1.73_m2} — ABNORMAL LOW (ref >59)

## 2022-06-23 LAB — HEMOGLOBIN A1C
Est. average glucose Bld gHb Est-mCnc: 114 mg/dL
Hgb A1c MFr Bld: 5.6 % (ref 4.8–5.6)

## 2022-06-23 LAB — URIC ACID: Uric Acid: 7.3 mg/dL (ref 3.1–7.9)

## 2022-06-23 LAB — LIPID PANEL W/O CHOL/HDL RATIO
Cholesterol, Total: 174 mg/dL (ref 100–199)
HDL: 67 mg/dL (ref >39)
LDL Chol Calc (NIH): 74 mg/dL (ref 0–99)
Triglycerides: 201 mg/dL — ABNORMAL HIGH (ref 0–149)
VLDL Cholesterol Cal: 33 mg/dL (ref 5–40)

## 2022-06-23 MED ORDER — FENOFIBRATE 145 MG PO TABS: 145.0000 mg | ORAL_TABLET | Freq: Every day | ORAL | 3 refills | Status: DC

## 2022-06-23 MED ORDER — ALLOPURINOL 300 MG PO TABS: 300.0000 mg | ORAL_TABLET | Freq: Every day | ORAL | 3 refills | Status: DC

## 2022-06-30 ENCOUNTER — Other Ambulatory Visit: Payer: Self-pay

## 2022-06-30 MED ORDER — BUDESONIDE-FORMOTEROL FUMARATE 160-4.5 MCG/ACT IN AERO: 2.0000 | INHALATION_SPRAY | Freq: Two times a day (BID) | RESPIRATORY_TRACT | 3 refills | Status: DC

## 2022-06-30 MED ORDER — FLUTICASONE PROPIONATE 50 MCG/ACT NA SUSP: 2.0000 | Freq: Every day | NASAL | 6 refills | Status: DC

## 2022-09-25 ENCOUNTER — Encounter: Payer: Self-pay | Admitting: Internal Medicine

## 2022-09-25 ENCOUNTER — Other Ambulatory Visit: Payer: Self-pay | Admitting: Internal Medicine

## 2022-09-25 ENCOUNTER — Ambulatory Visit (INDEPENDENT_AMBULATORY_CARE_PROVIDER_SITE_OTHER): Payer: Medicare Other | Admitting: Internal Medicine

## 2022-09-25 VITALS — BP 132/70 | HR 71 | Ht 60.0 in | Wt 144.6 lb

## 2022-09-25 DIAGNOSIS — M1A09X0 Idiopathic chronic gout, multiple sites, without tophus (tophi): Secondary | ICD-10-CM

## 2022-09-25 DIAGNOSIS — I10 Essential (primary) hypertension: Secondary | ICD-10-CM | POA: Diagnosis not present

## 2022-09-25 DIAGNOSIS — H60313 Diffuse otitis externa, bilateral: Secondary | ICD-10-CM

## 2022-09-25 DIAGNOSIS — E782 Mixed hyperlipidemia: Secondary | ICD-10-CM | POA: Diagnosis not present

## 2022-09-25 DIAGNOSIS — R7302 Impaired glucose tolerance (oral): Secondary | ICD-10-CM

## 2022-09-25 DIAGNOSIS — J301 Allergic rhinitis due to pollen: Secondary | ICD-10-CM | POA: Insufficient documentation

## 2022-09-25 MED ORDER — CIPRO HC 0.2-1 % OT SUSP
4.0000 [drp] | Freq: Two times a day (BID) | OTIC | 0 refills | Status: DC
Start: 2022-09-25 — End: 2022-09-25

## 2022-09-25 MED ORDER — NEOMYCIN-POLYMYXIN-HC 3.5-10000-1 OT SOLN
3.0000 [drp] | Freq: Three times a day (TID) | OTIC | 0 refills | Status: DC
Start: 2022-09-25 — End: 2022-09-25

## 2022-09-25 NOTE — Progress Notes (Signed)
Established Patient Office Visit  Subjective:  Patient ID: Linda Roy, female    DOB: [birth year]  Age: 72 y.o. MRN: [id number]  Chief Complaint  Patient presents with   Follow-up    3 month follow up    Patient comes in for her follow-up today.  She is generally feeling well but complains of irritation and itching in both her ear canals.  She has been using her Flonase nasal spray as well as an antihistamine.  But on exam her external ear canal is slightly red and does look irritated.  Will send in prescription for Cipro HC otic eardrops. Patient is fasting for blood work. She has refused to get mammograms.    No other concerns at this time.   Past Medical History:  Diagnosis Date   Allergic rhinitis    Arthritis    lumbar back mild degenerative changes   Gout    Hyperlipidemia    Hypertension    Insomnia    Shortness of breath dyspnea     Past Surgical History:  Procedure Laterality Date   ABDOMINAL HYSTERECTOMY     COLONOSCOPY WITH PROPOFOL N/A 11/02/2014   Procedure: COLONOSCOPY WITH PROPOFOL;  Surgeon: Wallace Cullens, MD;  Location: [facility] ENDOSCOPY;  Service: Gastroenterology;  Laterality: N/A;    Social History   Socioeconomic History   Marital status: Single    Spouse name: Not on file   Number of children: Not on file   Years of education: Not on file   Highest education level: Not on file  Occupational History   Not on file  Tobacco Use   Smoking status: Never   Smokeless tobacco: Never  Substance and Sexual Activity   Alcohol use: Not on file   Drug use: Not on file   Sexual activity: Not on file  Other Topics Concern   Not on file  Social History Narrative   Not on file   Social Determinants of Health   Financial Resource Strain: Not on file  Food Insecurity: Not on file  Transportation Needs: Not on file  Physical Activity: Not on file  Stress: Not on file  Social Connections: Not on file  Intimate Partner Violence: Not on file     Family History  Problem Relation Age of Onset   Breast cancer Neg Hx     Allergies  Allergen Reactions   Penicillins Nausea Only and Shortness Of Breath    Other reaction(s): Nausea, Vomiting, Shortness of Breath / Dyspnea   Lisinopril     Note: cough    Review of Systems  Constitutional:  Negative for chills, diaphoresis, fever, malaise/fatigue and weight loss.  HENT:  Negative for congestion, ear discharge, ear pain, hearing loss, nosebleeds, sinus pain, sore throat and tinnitus.   Respiratory:  Negative for cough, shortness of breath, wheezing and stridor.   Cardiovascular:  Negative for chest pain, palpitations, orthopnea, claudication, leg swelling and PND.  Gastrointestinal:  Negative for abdominal pain, blood in stool, constipation, diarrhea, heartburn, melena, nausea and vomiting.  Genitourinary:  Negative for dysuria and urgency.  Musculoskeletal:  Negative for back pain, falls, joint pain, myalgias and neck pain.  Neurological:  Negative for dizziness, tingling, sensory change, loss of consciousness, weakness and headaches.  Psychiatric/Behavioral:  Negative for depression. The patient is not nervous/anxious and does not have insomnia.        Objective:   BP 132/70   Pulse 71   Ht 5' (1.524 m)   Wt 144 lb  9.6 oz (65.6 kg)   SpO2 95%   BMI 28.24 kg/m   Vitals:   09/25/22 1300  BP: 132/70  Pulse: 71  Height: 5' (1.524 m)  Weight: 144 lb 9.6 oz (65.6 kg)  SpO2: 95%  BMI (Calculated): 28.24    Physical Exam Vitals and nursing note reviewed.  Constitutional:      Appearance: Normal appearance.  HENT:     Head: Normocephalic.     Right Ear: A middle ear effusion is present.     Left Ear: A middle ear effusion is present.     Nose: Nose normal.  Eyes:     General:        Right eye: No discharge.        Left eye: No discharge.  Cardiovascular:     Rate and Rhythm: Normal rate and regular rhythm.     Pulses: Normal pulses.     Heart sounds: Normal  heart sounds. No murmur heard. Pulmonary:     Effort: Pulmonary effort is normal.     Breath sounds: Normal breath sounds. No wheezing, rhonchi or rales.  Abdominal:     Palpations: Abdomen is soft.  Musculoskeletal:        General: Normal range of motion.     Cervical back: Normal range of motion and neck supple.     Right lower leg: No edema.     Left lower leg: No edema.  Neurological:     General: No focal deficit present.     Mental Status: She is alert and oriented to person, place, and time.  Psychiatric:        Mood and Affect: Mood normal.        Behavior: Behavior normal.      No results found for any visits on 09/25/22.  No results found for this or any previous visit (from the past 2160 hour(s)).    Assessment & Plan:  Check labs today. Ear drops for both ears. Problem List Items Addressed This Visit     Hyperlipidemia   Relevant Orders   Lipid Panel w/o Chol/HDL Ratio   Essential hypertension, benign   Relevant Orders   CMP14+EGFR   Impaired glucose tolerance   Seasonal allergic rhinitis due to pollen   Other Visit Diagnoses     Diffuse otitis externa of both ears, unspecified chronicity    -  Primary   Relevant Orders   CBC With Differential   Chronic gout of multiple sites, unspecified cause       Relevant Orders   Uric acid       Return in about 4 months (around 01/25/2023).   Total time spent: 30 minutes  Margaretann Loveless, MD  09/25/2022   This document may have been prepared by [facility] Voice Recognition software and as such may include unintentional dictation errors.

## 2022-09-26 LAB — CBC WITH DIFFERENTIAL
Basophils Absolute: 0.1 10*3/uL (ref 0.0–0.2)
Basos: 1 %
EOS (ABSOLUTE): 0.2 10*3/uL (ref 0.0–0.4)
Eos: 3 %
Hematocrit: 38.2 % (ref 34.0–46.6)
Hemoglobin: 12.8 g/dL (ref 11.1–15.9)
Immature Grans (Abs): 0 10*3/uL (ref 0.0–0.1)
Immature Granulocytes: 0 %
Lymphocytes Absolute: 3.2 10*3/uL — ABNORMAL HIGH (ref 0.7–3.1)
Lymphs: 44 %
MCH: 30 pg (ref 26.6–33.0)
MCHC: 33.5 g/dL (ref 31.5–35.7)
MCV: 90 fL (ref 79–97)
Monocytes Absolute: 0.6 10*3/uL (ref 0.1–0.9)
Monocytes: 8 %
Neutrophils Absolute: 3.1 10*3/uL (ref 1.4–7.0)
Neutrophils: 44 %
RBC: 4.27 x10E6/uL (ref 3.77–5.28)
RDW: 13.1 % (ref 11.7–15.4)
WBC: 7.1 10*3/uL (ref 3.4–10.8)

## 2022-09-26 LAB — CMP14+EGFR
ALT: 19 IU/L (ref 0–32)
AST: 20 IU/L (ref 0–40)
Albumin/Globulin Ratio: 2
Albumin: 4.7 g/dL (ref 3.8–4.8)
Alkaline Phosphatase: 92 IU/L (ref 44–121)
BUN/Creatinine Ratio: 30 — ABNORMAL HIGH (ref 12–28)
BUN: 23 mg/dL (ref 8–27)
Bilirubin Total: 0.3 mg/dL (ref 0.0–1.2)
CO2: 24 mmol/L (ref 20–29)
Calcium: 10.3 mg/dL (ref 8.7–10.3)
Chloride: 102 mmol/L (ref 96–106)
Creatinine, Ser: 0.77 mg/dL (ref 0.57–1.00)
Globulin, Total: 2.3 g/dL (ref 1.5–4.5)
Glucose: 84 mg/dL (ref 70–99)
Potassium: 5.1 mmol/L (ref 3.5–5.2)
Sodium: 138 mmol/L (ref 134–144)
Total Protein: 7 g/dL (ref 6.0–8.5)
eGFR: 81 mL/min/{1.73_m2} (ref >59)

## 2022-09-26 LAB — URIC ACID: Uric Acid: 5.3 mg/dL (ref 3.1–7.9)

## 2022-09-26 LAB — LIPID PANEL W/O CHOL/HDL RATIO
Cholesterol, Total: 164 mg/dL (ref 100–199)
HDL: 78 mg/dL (ref >39)
LDL Chol Calc (NIH): 68 mg/dL (ref 0–99)
Triglycerides: 100 mg/dL (ref 0–149)
VLDL Cholesterol Cal: 18 mg/dL (ref 5–40)

## 2022-10-03 NOTE — Progress Notes (Signed)
Patient notified

## 2022-10-26 ENCOUNTER — Other Ambulatory Visit: Payer: Self-pay | Admitting: Internal Medicine

## 2022-10-26 DIAGNOSIS — M1A09X0 Idiopathic chronic gout, multiple sites, without tophus (tophi): Secondary | ICD-10-CM

## 2022-10-31 ENCOUNTER — Other Ambulatory Visit: Payer: Self-pay | Admitting: Internal Medicine

## 2022-11-20 ENCOUNTER — Other Ambulatory Visit: Payer: Self-pay | Admitting: Internal Medicine

## 2022-11-20 DIAGNOSIS — M109 Gout, unspecified: Secondary | ICD-10-CM

## 2023-01-25 ENCOUNTER — Ambulatory Visit: Payer: Medicare Other | Admitting: Internal Medicine

## 2023-01-26 ENCOUNTER — Other Ambulatory Visit: Payer: Self-pay | Admitting: Internal Medicine

## 2023-01-26 DIAGNOSIS — J301 Allergic rhinitis due to pollen: Secondary | ICD-10-CM

## 2023-01-26 DIAGNOSIS — I10 Essential (primary) hypertension: Secondary | ICD-10-CM

## 2023-02-22 ENCOUNTER — Ambulatory Visit (INDEPENDENT_AMBULATORY_CARE_PROVIDER_SITE_OTHER): Payer: Medicare Other | Admitting: Internal Medicine

## 2023-02-22 ENCOUNTER — Encounter: Payer: Self-pay | Admitting: Internal Medicine

## 2023-02-22 VITALS — BP 130/72 | HR 66 | Ht 63.0 in | Wt 146.2 lb

## 2023-02-22 DIAGNOSIS — I10 Essential (primary) hypertension: Secondary | ICD-10-CM | POA: Diagnosis not present

## 2023-02-22 DIAGNOSIS — M1A09X0 Idiopathic chronic gout, multiple sites, without tophus (tophi): Secondary | ICD-10-CM | POA: Diagnosis not present

## 2023-02-22 DIAGNOSIS — R051 Acute cough: Secondary | ICD-10-CM

## 2023-02-22 DIAGNOSIS — R7303 Prediabetes: Secondary | ICD-10-CM

## 2023-02-22 DIAGNOSIS — E782 Mixed hyperlipidemia: Secondary | ICD-10-CM | POA: Diagnosis not present

## 2023-02-22 DIAGNOSIS — L2989 Other pruritus: Secondary | ICD-10-CM

## 2023-02-22 LAB — POCT XPERT XPRESS SARS COVID-2/FLU/RSV
FLU A: NEGATIVE
FLU B: NEGATIVE
RSV RNA, PCR: NEGATIVE
SARS Coronavirus 2: NEGATIVE

## 2023-02-22 MED ORDER — MOMETASONE FUROATE 0.1 % EX SOLN
Freq: Every day | CUTANEOUS | 0 refills | Status: DC
Start: 2023-02-22 — End: 2023-03-05

## 2023-02-22 NOTE — Progress Notes (Signed)
Established Patient Office Visit  Subjective:  Patient ID: Linda Roy, female    DOB: 1950/10/09  Age: 72 y.o. MRN: [id number]  Chief Complaint  Patient presents with   Follow-up    Follow up    Patient is here for her follow-up.  She just returned from [place] after several months.  Today complains of irritation and itching in her scalp as she recently tried using tea tree oil and shampoo.  On exam the area looks red and irritated, no cellulitis and no scratches. Patient advised to stop using that shampoo, prescription for a steroid lotion will be  sent.  Patient to use baby shampoo meanwhile. Patient is also fasting for blood work. Mentions mild sore throat, COVID test in the office is negative. No fevers no chills and no cough.  Can take some Tylenol and do saline gargles.    No other concerns at this time.   Past Medical History:  Diagnosis Date   Allergic rhinitis    Arthritis    lumbar back mild degenerative changes   Gout    Hyperlipidemia    Hypertension    Insomnia    Shortness of breath dyspnea     Past Surgical History:  Procedure Laterality Date   ABDOMINAL HYSTERECTOMY     COLONOSCOPY WITH PROPOFOL N/A 11/02/2014   Procedure: COLONOSCOPY WITH PROPOFOL;  Surgeon: Wallace Cullens, MD;  Location: [facility] ENDOSCOPY;  Service: Gastroenterology;  Laterality: N/A;    Social History   Socioeconomic History   Marital status: Single    Spouse name: Not on file   Number of children: Not on file   Years of education: Not on file   Highest education level: Not on file  Occupational History   Not on file  Tobacco Use   Smoking status: Never   Smokeless tobacco: Never  Substance and Sexual Activity   Alcohol use: Not on file   Drug use: Not on file   Sexual activity: Not on file  Other Topics Concern   Not on file  Social History Narrative   Not on file   Social Determinants of Health   Financial Resource Strain: Low Risk  (11/11/2020)   Received from  [facility], [facility]   Overall Financial Resource Strain (CARDIA)    Difficulty of Paying Living Expenses: Not hard at all  Food Insecurity: No Food Insecurity (11/11/2020)   Received from [facility], [facility]   Hunger Vital Sign    Worried About Running Out of Food in the Last Year: Never true    Ran Out of Food in the Last Year: Never true  Transportation Needs: No Transportation Needs (11/11/2020)   Received from [facility], [facility]   [facility] - Transportation    Lack of Transportation (Medical): No    Lack of Transportation (Non-Medical): No  Physical Activity: Not on file  Stress: Not on file  Social Connections: Not on file  Intimate Partner Violence: Not on file    Family History  Problem Relation Age of Onset   Breast cancer Neg Hx     Allergies  Allergen Reactions   Penicillins Nausea Only and Shortness Of Breath    Other reaction(s): Nausea, Vomiting, Shortness of Breath / Dyspnea   Lisinopril     Note: cough    Review of Systems  Constitutional: Negative.  Negative for chills, fever, malaise/fatigue and weight loss.  HENT:  Positive for sore throat. Negative for  congestion, ear pain and sinus pain.   Eyes: Negative.   Respiratory: Negative.  Negative for cough and shortness of breath.   Cardiovascular: Negative.  Negative for chest pain, palpitations and leg swelling.  Gastrointestinal: Negative.  Negative for abdominal pain, constipation, diarrhea, heartburn, nausea and vomiting.  Genitourinary: Negative.  Negative for dysuria and flank pain.  Musculoskeletal: Negative.  Negative for joint pain and myalgias.  Skin:  Positive for rash (scalp).  Neurological: Negative.  Negative for dizziness and headaches.  Endo/Heme/Allergies: Negative.   Psychiatric/Behavioral: Negative.  Negative for depression and suicidal ideas. The patient is not nervous/anxious.        Objective:   BP 130/72   Pulse 66   Ht 5\' 3"  (1.6 m)   Wt  146 lb 3.2 oz (66.3 kg)   SpO2 96%   BMI 25.90 kg/m   Vitals:   02/22/23 1434  BP: 130/72  Pulse: 66  Height: 5\' 3"  (1.6 m)  Weight: 146 lb 3.2 oz (66.3 kg)  SpO2: 96%  BMI (Calculated): 25.9    Physical Exam Vitals and nursing note reviewed.  Constitutional:      Appearance: Normal appearance.  HENT:     Head: Normocephalic and atraumatic.     Nose: Nose normal.     Mouth/Throat:     Mouth: Mucous membranes are moist.     Pharynx: Oropharynx is clear.  Eyes:     Conjunctiva/sclera: Conjunctivae normal.     Pupils: Pupils are equal, round, and reactive to light.  Cardiovascular:     Rate and Rhythm: Normal rate and regular rhythm.     Pulses: Normal pulses.     Heart sounds: Normal heart sounds. No murmur heard. Pulmonary:     Effort: Pulmonary effort is normal.     Breath sounds: Normal breath sounds. No wheezing.  Abdominal:     General: Bowel sounds are normal.     Palpations: Abdomen is soft.     Tenderness: There is no abdominal tenderness. There is no right CVA tenderness or left CVA tenderness.  Musculoskeletal:        General: Normal range of motion.     Cervical back: Normal range of motion.     Right lower leg: No edema.     Left lower leg: No edema.  Skin:    General: Skin is warm and dry.  Neurological:     General: No focal deficit present.     Mental Status: She is alert and oriented to person, place, and time.  Psychiatric:        Mood and Affect: Mood normal.        Behavior: Behavior normal.      Results for orders placed or performed in visit on 02/22/23  POCT XPERT XPRESS SARS COVID-2/FLU/RSV  Result Value Ref Range   SARS Coronavirus 2 neg    FLU A neg    FLU B neg    RSV RNA, PCR neg     Recent Results (from the past 2160 hour(s))  POCT XPERT XPRESS SARS COVID-2/FLU/RSV     Status: None   Collection Time: 02/22/23  3:46 PM  Result Value Ref Range   SARS Coronavirus 2 neg    FLU A neg    FLU B neg    RSV RNA, PCR neg        Assessment & Plan:  To continue all medications.  Check labs today. Schedule mammogram. To use a steroid lotion for the scalp and  baby shampoo. Problem List Items Addressed This Visit     Hyperlipidemia   Relevant Orders   Lipid Panel w/o Chol/HDL Ratio   Essential hypertension, benign - Primary   Relevant Orders   CMP14+EGFR   Prediabetes   Relevant Orders   Hemoglobin A1c   Other Visit Diagnoses     Pruritic dermatosis of scalp       Relevant Medications   mometasone (ELOCON) 0.1 % lotion   Chronic gout of multiple sites, unspecified cause       Relevant Orders   CBC with Diff   Uric acid   Acute cough       Relevant Orders   POCT XPERT XPRESS SARS COVID-2/FLU/RSV (Completed)       Follow up 3 months.  Total time spent: 30 minutes  Margaretann Loveless, MD  02/22/2023   This document may have been prepared by [facility] Voice Recognition software and as such may include unintentional dictation errors.

## 2023-02-23 ENCOUNTER — Other Ambulatory Visit: Payer: Self-pay | Admitting: Internal Medicine

## 2023-02-23 DIAGNOSIS — M1A09X0 Idiopathic chronic gout, multiple sites, without tophus (tophi): Secondary | ICD-10-CM

## 2023-02-23 LAB — CMP14+EGFR
ALT: 27 [IU]/L (ref 0–32)
AST: 28 [IU]/L (ref 0–40)
Albumin: 4.7 g/dL (ref 3.8–4.8)
Alkaline Phosphatase: 74 [IU]/L (ref 44–121)
BUN/Creatinine Ratio: 17 (ref 12–28)
BUN: 20 mg/dL (ref 8–27)
Bilirubin Total: 0.3 mg/dL (ref 0.0–1.2)
CO2: 22 mmol/L (ref 20–29)
Calcium: 9.9 mg/dL (ref 8.7–10.3)
Chloride: 103 mmol/L (ref 96–106)
Creatinine, Ser: 1.18 mg/dL — ABNORMAL HIGH (ref 0.57–1.00)
Globulin, Total: 2.3 g/dL (ref 1.5–4.5)
Glucose: 82 mg/dL (ref 70–99)
Potassium: 5.4 mmol/L — ABNORMAL HIGH (ref 3.5–5.2)
Sodium: 140 mmol/L (ref 134–144)
Total Protein: 7 g/dL (ref 6.0–8.5)
eGFR: 48 mL/min/{1.73_m2} — ABNORMAL LOW (ref >59)

## 2023-02-23 LAB — CBC WITH DIFFERENTIAL/PLATELET
Basophils Absolute: 0 10*3/uL (ref 0.0–0.2)
Basos: 1 %
EOS (ABSOLUTE): 0.2 10*3/uL (ref 0.0–0.4)
Eos: 3 %
Hematocrit: 39.4 % (ref 34.0–46.6)
Hemoglobin: 12.8 g/dL (ref 11.1–15.9)
Immature Grans (Abs): 0 10*3/uL (ref 0.0–0.1)
Immature Granulocytes: 0 %
Lymphocytes Absolute: 2 10*3/uL (ref 0.7–3.1)
Lymphs: 26 %
MCH: 29.8 pg (ref 26.6–33.0)
MCHC: 32.5 g/dL (ref 31.5–35.7)
MCV: 92 fL (ref 79–97)
Monocytes Absolute: 0.5 10*3/uL (ref 0.1–0.9)
Monocytes: 6 %
Neutrophils Absolute: 4.9 10*3/uL (ref 1.4–7.0)
Neutrophils: 64 %
Platelets: 276 10*3/uL (ref 150–450)
RBC: 4.29 x10E6/uL (ref 3.77–5.28)
RDW: 13.1 % (ref 11.7–15.4)
WBC: 7.7 10*3/uL (ref 3.4–10.8)

## 2023-02-23 LAB — LIPID PANEL W/O CHOL/HDL RATIO
Cholesterol, Total: 175 mg/dL (ref 100–199)
HDL: 71 mg/dL (ref >39)
LDL Chol Calc (NIH): 78 mg/dL (ref 0–99)
Triglycerides: 152 mg/dL — ABNORMAL HIGH (ref 0–149)
VLDL Cholesterol Cal: 26 mg/dL (ref 5–40)

## 2023-02-23 LAB — HEMOGLOBIN A1C
Est. average glucose Bld gHb Est-mCnc: 117 mg/dL
Hgb A1c MFr Bld: 5.7 % — ABNORMAL HIGH (ref 4.8–5.6)

## 2023-02-23 LAB — URIC ACID: Uric Acid: 7.6 mg/dL (ref 3.1–7.9)

## 2023-02-23 MED ORDER — ALLOPURINOL 300 MG PO TABS
300.0000 mg | ORAL_TABLET | Freq: Every day | ORAL | 3 refills | Status: DC
Start: 2023-02-23 — End: 2024-01-28

## 2023-02-28 ENCOUNTER — Other Ambulatory Visit: Payer: Self-pay | Admitting: Internal Medicine

## 2023-03-05 ENCOUNTER — Ambulatory Visit (INDEPENDENT_AMBULATORY_CARE_PROVIDER_SITE_OTHER): Payer: Medicare Other | Admitting: Internal Medicine

## 2023-03-05 ENCOUNTER — Encounter: Payer: Self-pay | Admitting: Internal Medicine

## 2023-03-05 VITALS — BP 140/88 | HR 67 | Ht 63.0 in | Wt 145.6 lb

## 2023-03-05 DIAGNOSIS — E781 Pure hyperglyceridemia: Secondary | ICD-10-CM | POA: Diagnosis not present

## 2023-03-05 DIAGNOSIS — L2989 Other pruritus: Secondary | ICD-10-CM | POA: Insufficient documentation

## 2023-03-05 DIAGNOSIS — J301 Allergic rhinitis due to pollen: Secondary | ICD-10-CM

## 2023-03-05 DIAGNOSIS — M1A9XX0 Chronic gout, unspecified, without tophus (tophi): Secondary | ICD-10-CM

## 2023-03-05 DIAGNOSIS — I10 Essential (primary) hypertension: Secondary | ICD-10-CM

## 2023-03-05 DIAGNOSIS — R7303 Prediabetes: Secondary | ICD-10-CM

## 2023-03-05 MED ORDER — LISINOPRIL 10 MG PO TABS: 10.0000 mg | ORAL_TABLET | Freq: Every day | ORAL | 3 refills | Status: AC

## 2023-03-05 MED ORDER — FENOFIBRATE 145 MG PO TABS: 145.0000 mg | ORAL_TABLET | Freq: Every day | ORAL | 3 refills | Status: DC

## 2023-03-05 MED ORDER — MOMETASONE FUROATE 0.1 % EX SOLN: Freq: Every day | CUTANEOUS | 0 refills | Status: DC

## 2023-03-05 NOTE — Progress Notes (Signed)
Established Patient Office Visit  Subjective:  Patient ID: Linda Roy, female    DOB: 1950/09/03  Age: 72 y.o. MRN: [id number]  Chief Complaint  Patient presents with   Follow-up    10 day follow up    Patient comes in for her follow-up, accompanied by her daughter, who helps to translate.  She is planning to go back to [place] within a week. She did not pick up her Elocon lotion for scalp yet, so's continues to have irritation and mild redness of her scalp although it has improved from before since she stopped using the tea tree oil and shampoo.  Patient advised to pick up her prescription and start using it. Her blood pressure is high today but admits to have not taken her medications yet.  Lab results discussed.  Needs to drink more water and to take her cholesterol medications regularly.    No other concerns at this time.   Past Medical History:  Diagnosis Date   Allergic rhinitis    Arthritis    lumbar back mild degenerative changes   Gout    Hyperlipidemia    Hypertension    Insomnia    Shortness of breath dyspnea     Past Surgical History:  Procedure Laterality Date   ABDOMINAL HYSTERECTOMY     COLONOSCOPY WITH PROPOFOL N/A 11/02/2014   Procedure: COLONOSCOPY WITH PROPOFOL;  Surgeon: Wallace Cullens, MD;  Location: [facility] END[facility]COPY;  Service: Gastroenterology;  Laterality: N/A;    Social History   Socioeconomic History   Marital status: Single    Spouse name: Not on file   Number of children: Not on file   Years of education: Not on file   Highest education level: Not on file  Occupational History   Not on file  Tobacco Use   Smoking status: Never   Smokeless tobacco: Never  Substance and Sexual Activity   Alcohol use: Not on file   Drug use: Not on file   Sexual activity: Not on file  Other Topics Concern   Not on file  Social History Narrative   Not on file   Social Determinants of Health   Financial Resource Strain: Low Risk  (11/11/2020)    Received from [facility], [facility]   Overall Financial Resource Strain (CARDIA)    Difficulty of Paying Living Expenses: Not hard at all  Food Insecurity: No Food Insecurity (11/11/2020)   Received from [facility], [facility]   Hunger Vital Sign    Worried About Running Out of Food in the Last Year: Never true    Ran Out of Food in the Last Year: Never true  Transportation Needs: No Transportation Needs (11/11/2020)   Received from [facility], [facility]   [facility] - Transportation    Lack of Transportation (Medical): No    Lack of Transportation (Non-Medical): No  Physical Activity: Not on file  Stress: Not on file  Social Connections: Not on file  Intimate Partner Violence: Not on file    Family History  Problem Relation Age of Onset   Breast cancer Neg Hx     Allergies  Allergen Reactions   Penicillins Nausea Only and Shortness Of Breath    Other reaction(s): Nausea, Vomiting, Shortness of Breath / Dyspnea   Lisinopril     Note: cough    Outpatient Medications Prior to Visit  Medication Sig   allopurinol (ZYLOPRIM) 300 MG tablet Take 1 tablet (300 mg  total) by mouth daily.   amLODipine (NORVASC) 5 MG tablet TAKE 1 TABLET BY MOUTH EVERY DAY   cetirizine (ZYRTEC) 10 MG tablet Take 10 mg by mouth daily.   colchicine 0.6 MG tablet TAKE 1 TABLET BY MOUTH 2 TIMES DAILY.   diclofenac Sodium (VOLTAREN) 1 % GEL USE AS DIRECTED TWICE A DAY USE ON AFFECTED AREA   baclofen (LIORESAL) 10 MG tablet Take 10 mg by mouth 2 (two) times daily. (Patient not taking: Reported on 09/25/2022)   Cholecalciferol 1.25 MG (50000 UT) capsule Take 50,000 Units by mouth daily. (Patient not taking: Reported on 09/25/2022)   famotidine (PEPCID) 40 MG tablet TAKE 1 TABLET BY MOUTH EVERY DAY (Patient not taking: Reported on 02/22/2023)   fluticasone (FLONASE) 50 MCG/ACT nasal spray SPRAY 2 SPRAYS INTO EACH NOSTRIL EVERY DAY (Patient not taking: Reported on 02/22/2023)   gabapentin  (NEURONTIN) 100 MG capsule Take 100 mg by mouth at bedtime. (Patient not taking: Reported on 09/25/2022)   meloxicam (MOBIC) 7.5 MG tablet TAKE 1 TABLET BY MOUTH EVERY DAY (Patient not taking: Reported on 02/22/2023)   SYMBICORT 160-4.5 MCG/ACT inhaler INHALE 2 PUFFS INTO THE LUNGS TWICE A DAY (Patient not taking: Reported on 02/22/2023)   [DISCONTINUED] fenofibrate (TRICOR) 145 MG tablet Take 1 tablet (145 mg total) by mouth daily. (Patient not taking: Reported on 02/22/2023)   [DISCONTINUED] lisinopril (PRINIVIL,ZESTRIL) 10 MG tablet Take 10 mg by mouth daily. (Patient not taking: Reported on 09/25/2022)   [DISCONTINUED] mometasone (ELOCON) 0.1 % lotion Apply topically daily. (Patient not taking: Reported on 03/05/2023)   No facility-administered medications prior to visit.    Review of Systems  Constitutional: Negative.  Negative for chills, diaphoresis, fever, malaise/fatigue and weight loss.  HENT: Negative.  Negative for sore throat.   Eyes: Negative.   Respiratory: Negative.  Negative for cough, shortness of breath and stridor.   Cardiovascular: Negative.  Negative for chest pain, palpitations and leg swelling.  Gastrointestinal: Negative.  Negative for abdominal pain, constipation, diarrhea, heartburn, nausea and vomiting.  Genitourinary: Negative.  Negative for dysuria and flank pain.  Musculoskeletal: Negative.  Negative for joint pain and myalgias.  Skin:  Positive for itching and rash (scalp).  Neurological: Negative.  Negative for dizziness and headaches.  Endo/Heme/Allergies: Negative.   Psychiatric/Behavioral: Negative.  Negative for depression and suicidal ideas. The patient is not nervous/anxious.        Objective:   BP (!) 140/88   Pulse 67   Ht 5\' 3"  (1.6 m)   Wt 145 lb 9.6 oz (66 kg)   SpO2 96%   BMI 25.79 kg/m   Vitals:   03/05/23 1427  BP: (!) 140/88  Pulse: 67  Height: 5\' 3"  (1.6 m)  Weight: 145 lb 9.6 oz (66 kg)  SpO2: 96%  BMI (Calculated): 25.8     Physical Exam Vitals and nursing note reviewed.  Constitutional:      Appearance: Normal appearance.  HENT:     Head: Normocephalic and atraumatic.     Nose: Nose normal.     Mouth/Throat:     Mouth: Mucous membranes are moist.     Pharynx: Oropharynx is clear.  Eyes:     Conjunctiva/sclera: Conjunctivae normal.     Pupils: Pupils are equal, round, and reactive to light.  Cardiovascular:     Rate and Rhythm: Normal rate and regular rhythm.     Pulses: Normal pulses.     Heart sounds: Normal heart sounds. No murmur heard. Pulmonary:  Effort: Pulmonary effort is normal.     Breath sounds: Normal breath sounds. No wheezing.  Abdominal:     General: Bowel sounds are normal.     Palpations: Abdomen is soft.     Tenderness: There is no abdominal tenderness. There is no right CVA tenderness or left CVA tenderness.  Musculoskeletal:        General: Normal range of motion.     Cervical back: Normal range of motion.     Right lower leg: No edema.     Left lower leg: No edema.  Skin:    General: Skin is warm and dry.  Neurological:     General: No focal deficit present.     Mental Status: She is alert and oriented to person, place, and time.  Psychiatric:        Mood and Affect: Mood normal.        Behavior: Behavior normal.      No results found for any visits on 03/05/23.  Recent Results (from the past 2160 hour(s))  CBC with Diff     Status: None   Collection Time: 02/22/23  3:23 PM  Result Value Ref Range   WBC 7.7 3.4 - 10.8 x10E3/uL   RBC 4.29 3.77 - 5.28 x10E6/uL   Hemoglobin 12.8 11.1 - 15.9 g/dL   Hematocrit 16.1 09.6 - 46.6 %   MCV 92 79 - 97 fL   MCH 29.8 26.6 - 33.0 pg   MCHC 32.5 31.5 - 35.7 g/dL   RDW 04.5 40.9 - 81.1 %   Platelets 276 150 - 450 x10E3/uL   Neutrophils 64 Not Estab. %   Lymphs 26 Not Estab. %   Monocytes 6 Not Estab. %   Eos 3 Not Estab. %   Basos 1 Not Estab. %   Neutrophils Absolute 4.9 1.4 - 7.0 x10E3/uL   Lymphocytes  Absolute 2.0 0.7 - 3.1 x10E3/uL   Monocytes Absolute 0.5 0.1 - 0.9 x10E3/uL   EOS (ABSOLUTE) 0.2 0.0 - 0.4 x10E3/uL   Basophils Absolute 0.0 0.0 - 0.2 x10E3/uL   Immature Granulocytes 0 Not Estab. %   Immature Grans (Abs) 0.0 0.0 - 0.1 x10E3/uL  CMP14+EGFR     Status: Abnormal   Collection Time: 02/22/23  3:23 PM  Result Value Ref Range   Glucose 82 70 - 99 mg/dL   BUN 20 8 - 27 mg/dL   Creatinine, Ser 9.14 (H) 0.57 - 1.00 mg/dL   eGFR 48 (L) >78 GN/FAO/1.30   BUN/Creatinine Ratio 17 12 - 28   Sodium 140 134 - 144 mmol/L   Potassium 5.4 (H) 3.5 - 5.2 mmol/L   Chloride 103 96 - 106 mmol/L   CO2 22 20 - 29 mmol/L   Calcium 9.9 8.7 - 10.3 mg/dL   Total Protein 7.0 6.0 - 8.5 g/dL   Albumin 4.7 3.8 - 4.8 g/dL   Globulin, Total 2.3 1.5 - 4.5 g/dL   Bilirubin Total 0.3 0.0 - 1.2 mg/dL   Alkaline Phosphatase 74 44 - 121 IU/L   AST 28 0 - 40 IU/L   ALT 27 0 - 32 IU/L  Lipid Panel w/o Chol/HDL Ratio     Status: Abnormal   Collection Time: 02/22/23  3:23 PM  Result Value Ref Range   Cholesterol, Total 175 100 - 199 mg/dL   Triglycerides 865 (H) 0 - 149 mg/dL   HDL 71 >78 mg/dL   VLDL Cholesterol Cal 26 5 - 40 mg/dL   LDL Chol  Calc (NIH) 78 0 - 99 mg/dL  Uric acid     Status: None   Collection Time: 02/22/23  3:23 PM  Result Value Ref Range   Uric Acid 7.6 3.1 - 7.9 mg/dL    Comment:            Therapeutic target for gout patients: <6.0  Hemoglobin A1c     Status: Abnormal   Collection Time: 02/22/23  3:23 PM  Result Value Ref Range   Hgb A1c MFr Bld 5.7 (H) 4.8 - 5.6 %    Comment:          Prediabetes: 5.7 - 6.4          Diabetes: >6.4          Glycemic control for adults with diabetes: <7.0    Est. average glucose Bld gHb Est-mCnc 117 mg/dL  POCT XPERT XPRESS SARS COVID-2/FLU/RSV     Status: None   Collection Time: 02/22/23  3:46 PM  Result Value Ref Range   SARS Coronavirus 2 neg    FLU A neg    FLU B neg    RSV RNA, PCR neg       Assessment & Plan:  Patient advised  to take her medications regularly.  She will return for follow-up once she comes back from her trip. Problem List Items Addressed This Visit     Hypertriglyceridemia   Relevant Medications   lisinopril (ZESTRIL) 10 MG tablet   fenofibrate (TRICOR) 145 MG tablet   Essential hypertension, benign - Primary   Relevant Medications   lisinopril (ZESTRIL) 10 MG tablet   fenofibrate (TRICOR) 145 MG tablet   Prediabetes   Chronic gout involving toe of left foot without tophus   Seasonal allergic rhinitis due to pollen   Pruritic dermatosis of scalp   Relevant Medications   mometasone (ELOCON) 0.1 % lotion    Follow up 4 months.  Total time spent: 25 minutes  Margaretann Loveless, MD  03/05/2023   This document may have been prepared by [facility] Voice Recognition software and as such may include unintentional dictation errors.

## 2023-03-19 ENCOUNTER — Ambulatory Visit (INDEPENDENT_AMBULATORY_CARE_PROVIDER_SITE_OTHER): Payer: Medicare Other | Admitting: Internal Medicine

## 2023-03-19 ENCOUNTER — Encounter: Payer: Self-pay | Admitting: Internal Medicine

## 2023-03-19 VITALS — BP 120/72 | HR 69 | Ht 63.0 in | Wt 148.4 lb

## 2023-03-19 DIAGNOSIS — R7303 Prediabetes: Secondary | ICD-10-CM

## 2023-03-19 DIAGNOSIS — E782 Mixed hyperlipidemia: Secondary | ICD-10-CM | POA: Diagnosis not present

## 2023-03-19 DIAGNOSIS — L2989 Other pruritus: Secondary | ICD-10-CM | POA: Diagnosis not present

## 2023-03-19 DIAGNOSIS — I10 Essential (primary) hypertension: Secondary | ICD-10-CM | POA: Diagnosis not present

## 2023-03-19 MED ORDER — MOMETASONE FUROATE 0.1 % EX SOLN: Freq: Every day | CUTANEOUS | 0 refills | Status: AC

## 2023-03-19 NOTE — Progress Notes (Signed)
Established Patient Office Visit  Subjective:  Patient ID: Linda Roy, female    DOB: 1950/06/15  Age: 72 y.o. MRN: [id number]  Chief Complaint  Patient presents with   Follow-up    2 week    Patient comes in for follow-up, accompanied by her daughter.  She is still in town and has not left for her trip.  Today her blood pressure is looking much better since she took her medications on time. She has still not picked up the steroid lotion for her scalp, wants it be to be sent to a different pharmacy.    No other concerns at this time.   Past Medical History:  Diagnosis Date   Allergic rhinitis    Arthritis    lumbar back mild degenerative changes   Gout    Hyperlipidemia    Hypertension    Insomnia    Shortness of breath dyspnea     Past Surgical History:  Procedure Laterality Date   ABDOMINAL HYSTERECTOMY     COLONOSCOPY WITH PROPOFOL N/A 11/02/2014   Procedure: COLONOSCOPY WITH PROPOFOL;  Surgeon: Wallace Cullens, MD;  Location: [facility];  Service: Gastroenterology;  Laterality: N/A;    Social History   Socioeconomic History   Marital status: Single    Spouse name: Not on file   Number of children: Not on file   Years of education: Not on file   Highest education level: Not on file  Occupational History   Not on file  Tobacco Use   Smoking status: Never   Smokeless tobacco: Never  Substance and Sexual Activity   Alcohol use: Not on file   Drug use: Not on file   Sexual activity: Not on file  Other Topics Concern   Not on file  Social History Narrative   Not on file   Social Determinants of Health   Financial Resource Strain: Low Risk  (11/11/2020)   Received from [facility], [facility]   Overall Financial Resource Strain (CARDIA)    Difficulty of Paying Living Expenses: Not hard at all  Food Insecurity: No Food Insecurity (11/11/2020)   Received from [facility], [facility]   Hunger Vital Sign    Worried About Running Out  of Food in the Last Year: Never true    Ran Out of Food in the Last Year: Never true  Transportation Needs: No Transportation Needs (11/11/2020)   Received from [facility], [facility]   [facility] - Transportation    Lack of Transportation (Medical): No    Lack of Transportation (Non-Medical): No  Physical Activity: Not on file  Stress: Not on file  Social Connections: Not on file  Intimate Partner Violence: Not on file    Family History  Problem Relation Age of Onset   Breast cancer Neg Hx     Allergies  Allergen Reactions   Penicillins Nausea Only and Shortness Of Breath    Other reaction(s): Nausea, Vomiting, Shortness of Breath / Dyspnea   Lisinopril     Note: cough    Outpatient Medications Prior to Visit  Medication Sig   allopurinol (ZYLOPRIM) 300 MG tablet Take 1 tablet (300 mg total) by mouth daily.   amLODipine (NORVASC) 5 MG tablet TAKE 1 TABLET BY MOUTH EVERY DAY   cetirizine (ZYRTEC) 10 MG tablet Take 10 mg by mouth daily.   colchicine 0.6 MG tablet TAKE 1 TABLET BY MOUTH 2 TIMES DAILY.   diclofenac Sodium (VOLTAREN)  1 % GEL USE AS DIRECTED TWICE A DAY USE ON AFFECTED AREA   fenofibrate (TRICOR) 145 MG tablet Take 1 tablet (145 mg total) by mouth daily.   lisinopril (ZESTRIL) 10 MG tablet Take 1 tablet (10 mg total) by mouth daily.   [DISCONTINUED] mometasone (ELOCON) 0.1 % lotion Apply topically daily.   baclofen (LIORESAL) 10 MG tablet Take 10 mg by mouth 2 (two) times daily. (Patient not taking: Reported on 09/25/2022)   Cholecalciferol 1.25 MG (50000 UT) capsule Take 50,000 Units by mouth daily. (Patient not taking: Reported on 09/25/2022)   famotidine (PEPCID) 40 MG tablet TAKE 1 TABLET BY MOUTH EVERY DAY (Patient not taking: Reported on 02/22/2023)   fluticasone (FLONASE) 50 MCG/ACT nasal spray SPRAY 2 SPRAYS INTO EACH NOSTRIL EVERY DAY (Patient not taking: Reported on 02/22/2023)   gabapentin (NEURONTIN) 100 MG capsule Take 100 mg by mouth at bedtime.  (Patient not taking: Reported on 09/25/2022)   meloxicam (MOBIC) 7.5 MG tablet TAKE 1 TABLET BY MOUTH EVERY DAY (Patient not taking: Reported on 02/22/2023)   SYMBICORT 160-4.5 MCG/ACT inhaler INHALE 2 PUFFS INTO THE LUNGS TWICE A DAY (Patient not taking: Reported on 02/22/2023)   No facility-administered medications prior to visit.    Review of Systems  Constitutional: Negative.  Negative for chills, diaphoresis, fever, malaise/fatigue and weight loss.  HENT: Negative.  Negative for congestion, sinus pain and sore throat.   Eyes: Negative.  Negative for blurred vision.  Respiratory: Negative.  Negative for cough, shortness of breath and stridor.   Cardiovascular: Negative.  Negative for chest pain, palpitations and leg swelling.  Gastrointestinal: Negative.  Negative for abdominal pain, constipation, diarrhea, heartburn, nausea and vomiting.  Genitourinary: Negative.  Negative for dysuria and flank pain.  Musculoskeletal: Negative.  Negative for joint pain and myalgias.  Skin:  Positive for itching.  Neurological: Negative.  Negative for dizziness and headaches.  Endo/Heme/Allergies: Negative.   Psychiatric/Behavioral: Negative.  Negative for depression and suicidal ideas. The patient is not nervous/anxious.        Objective:   BP 120/72   Pulse 69   Ht 5\' 3"  (1.6 m)   Wt 148 lb 6.4 oz (67.3 kg)   SpO2 96%   BMI 26.29 kg/m   Vitals:   03/19/23 1438  BP: 120/72  Pulse: 69  Height: 5\' 3"  (1.6 m)  Weight: 148 lb 6.4 oz (67.3 kg)  SpO2: 96%  BMI (Calculated): 26.29    Physical Exam Vitals and nursing note reviewed.  Constitutional:      Appearance: Normal appearance.  HENT:     Head: Normocephalic and atraumatic.     Nose: Nose normal.     Mouth/Throat:     Mouth: Mucous membranes are moist.     Pharynx: Oropharynx is clear.  Eyes:     Conjunctiva/sclera: Conjunctivae normal.     Pupils: Pupils are equal, round, and reactive to light.  Cardiovascular:     Rate and  Rhythm: Normal rate and regular rhythm.     Pulses: Normal pulses.     Heart sounds: Normal heart sounds. No murmur heard. Pulmonary:     Effort: Pulmonary effort is normal.     Breath sounds: Normal breath sounds. No wheezing.  Abdominal:     General: Bowel sounds are normal.     Palpations: Abdomen is soft.     Tenderness: There is no abdominal tenderness. There is no right CVA tenderness or left CVA tenderness.  Musculoskeletal:  General: Normal range of motion.     Cervical back: Normal range of motion.     Right lower leg: No edema.     Left lower leg: No edema.  Skin:    General: Skin is warm and dry.  Neurological:     General: No focal deficit present.     Mental Status: She is alert and oriented to person, place, and time.  Psychiatric:        Mood and Affect: Mood normal.        Behavior: Behavior normal.      No results found for any visits on 03/19/23.  Recent Results (from the past 2160 hour(s))  CBC with Diff     Status: None   Collection Time: 02/22/23  3:23 PM  Result Value Ref Range   WBC 7.7 3.4 - 10.8 x10E3/uL   RBC 4.29 3.77 - 5.28 x10E6/uL   Hemoglobin 12.8 11.1 - 15.9 g/dL   Hematocrit 57.8 46.9 - 46.6 %   MCV 92 79 - 97 fL   MCH 29.8 26.6 - 33.0 pg   MCHC 32.5 31.5 - 35.7 g/dL   RDW 62.9 52.8 - 41.3 %   Platelets 276 150 - 450 x10E3/uL   Neutrophils 64 Not Estab. %   Lymphs 26 Not Estab. %   Monocytes 6 Not Estab. %   Eos 3 Not Estab. %   Basos 1 Not Estab. %   Neutrophils Absolute 4.9 1.4 - 7.0 x10E3/uL   Lymphocytes Absolute 2.0 0.7 - 3.1 x10E3/uL   Monocytes Absolute 0.5 0.1 - 0.9 x10E3/uL   EOS (ABSOLUTE) 0.2 0.0 - 0.4 x10E3/uL   Basophils Absolute 0.0 0.0 - 0.2 x10E3/uL   Immature Granulocytes 0 Not Estab. %   Immature Grans (Abs) 0.0 0.0 - 0.1 x10E3/uL  CMP14+EGFR     Status: Abnormal   Collection Time: 02/22/23  3:23 PM  Result Value Ref Range   Glucose 82 70 - 99 mg/dL   BUN 20 8 - 27 mg/dL   Creatinine, Ser 2.44 (H) 0.57  - 1.00 mg/dL   eGFR 48 (L) >01 UU/VOZ/3.66   BUN/Creatinine Ratio 17 12 - 28   Sodium 140 134 - 144 mmol/L   Potassium 5.4 (H) 3.5 - 5.2 mmol/L   Chloride 103 96 - 106 mmol/L   CO2 22 20 - 29 mmol/L   Calcium 9.9 8.7 - 10.3 mg/dL   Total Protein 7.0 6.0 - 8.5 g/dL   Albumin 4.7 3.8 - 4.8 g/dL   Globulin, Total 2.3 1.5 - 4.5 g/dL   Bilirubin Total 0.3 0.0 - 1.2 mg/dL   Alkaline Phosphatase 74 44 - 121 IU/L   AST 28 0 - 40 IU/L   ALT 27 0 - 32 IU/L  Lipid Panel w/o Chol/HDL Ratio     Status: Abnormal   Collection Time: 02/22/23  3:23 PM  Result Value Ref Range   Cholesterol, Total 175 100 - 199 mg/dL   Triglycerides 440 (H) 0 - 149 mg/dL   HDL 71 >34 mg/dL   VLDL Cholesterol Cal 26 5 - 40 mg/dL   LDL Chol Calc (NIH) 78 0 - 99 mg/dL  Uric acid     Status: None   Collection Time: 02/22/23  3:23 PM  Result Value Ref Range   Uric Acid 7.6 3.1 - 7.9 mg/dL    Comment:            Therapeutic target for gout patients: <6.0  Hemoglobin A1c  Status: Abnormal   Collection Time: 02/22/23  3:23 PM  Result Value Ref Range   Hgb A1c MFr Bld 5.7 (H) 4.8 - 5.6 %    Comment:          Prediabetes: 5.7 - 6.4          Diabetes: >6.4          Glycemic control for adults with diabetes: <7.0    Est. average glucose Bld gHb Est-mCnc 117 mg/dL  POCT XPERT XPRESS SARS COVID-2/FLU/RSV     Status: None   Collection Time: 02/22/23  3:46 PM  Result Value Ref Range   SARS Coronavirus 2 neg    FLU A neg    FLU B neg    RSV RNA, PCR neg       Assessment & Plan:  Patient advised to continue taking all her medications. Problem List Items Addressed This Visit     Essential hypertension, benign - Primary   Prediabetes   Pruritic dermatosis of scalp   Relevant Medications   mometasone (ELOCON) 0.1 % lotion   Mixed hyperlipidemia    No follow-ups on file.   Total time spent: 25 minutes  Margaretann Loveless, MD  03/19/2023   This document may have been prepared by [facility] Voice Recognition  software and as such may include unintentional dictation errors.

## 2023-05-25 ENCOUNTER — Other Ambulatory Visit: Payer: Self-pay | Admitting: Internal Medicine

## 2023-05-25 DIAGNOSIS — I10 Essential (primary) hypertension: Secondary | ICD-10-CM

## 2023-10-15 ENCOUNTER — Ambulatory Visit: Admitting: Internal Medicine

## 2023-10-29 ENCOUNTER — Other Ambulatory Visit: Payer: Self-pay | Admitting: Internal Medicine

## 2023-10-29 DIAGNOSIS — K219 Gastro-esophageal reflux disease without esophagitis: Secondary | ICD-10-CM

## 2024-01-28 ENCOUNTER — Encounter: Payer: Self-pay | Admitting: Internal Medicine

## 2024-01-28 ENCOUNTER — Ambulatory Visit: Admitting: Internal Medicine

## 2024-01-28 VITALS — BP 162/78 | HR 71 | Ht 63.0 in | Wt 145.2 lb

## 2024-01-28 DIAGNOSIS — I10 Essential (primary) hypertension: Secondary | ICD-10-CM

## 2024-01-28 DIAGNOSIS — R7303 Prediabetes: Secondary | ICD-10-CM | POA: Diagnosis not present

## 2024-01-28 DIAGNOSIS — Z13820 Encounter for screening for osteoporosis: Secondary | ICD-10-CM | POA: Insufficient documentation

## 2024-01-28 DIAGNOSIS — E782 Mixed hyperlipidemia: Secondary | ICD-10-CM

## 2024-01-28 DIAGNOSIS — E781 Pure hyperglyceridemia: Secondary | ICD-10-CM

## 2024-01-28 DIAGNOSIS — M1A09X0 Idiopathic chronic gout, multiple sites, without tophus (tophi): Secondary | ICD-10-CM | POA: Insufficient documentation

## 2024-01-28 DIAGNOSIS — Z1231 Encounter for screening mammogram for malignant neoplasm of breast: Secondary | ICD-10-CM | POA: Insufficient documentation

## 2024-01-28 DIAGNOSIS — M159 Polyosteoarthritis, unspecified: Secondary | ICD-10-CM

## 2024-01-28 DIAGNOSIS — K219 Gastro-esophageal reflux disease without esophagitis: Secondary | ICD-10-CM | POA: Insufficient documentation

## 2024-01-28 DIAGNOSIS — J301 Allergic rhinitis due to pollen: Secondary | ICD-10-CM

## 2024-01-28 MED ORDER — ALLOPURINOL 300 MG PO TABS: 300.0000 mg | ORAL_TABLET | Freq: Every day | ORAL | 3 refills | Status: DC

## 2024-01-28 MED ORDER — FAMOTIDINE 40 MG PO TABS
40.0000 mg | ORAL_TABLET | Freq: Every day | ORAL | 3 refills | Status: DC
Start: 2024-01-28 — End: 2024-02-07

## 2024-01-28 MED ORDER — FENOFIBRATE 145 MG PO TABS: 145.0000 mg | ORAL_TABLET | Freq: Every day | ORAL | 3 refills | Status: AC

## 2024-01-28 MED ORDER — CETIRIZINE HCL 10 MG PO TABS: 10.0000 mg | ORAL_TABLET | Freq: Every day | ORAL | 3 refills | Status: AC

## 2024-01-28 MED ORDER — GABAPENTIN 100 MG PO CAPS: 100.0000 mg | ORAL_CAPSULE | Freq: Every day | ORAL | 3 refills | Status: AC

## 2024-01-28 MED ORDER — MELOXICAM 7.5 MG PO TABS
7.5000 mg | ORAL_TABLET | Freq: Every day | ORAL | 3 refills | Status: DC
Start: 2024-01-28 — End: 2024-02-07

## 2024-01-28 MED ORDER — BACLOFEN 10 MG PO TABS: 10.0000 mg | ORAL_TABLET | Freq: Two times a day (BID) | ORAL | 1 refills | Status: AC | PRN

## 2024-01-28 MED ORDER — AMLODIPINE BESYLATE 5 MG PO TABS: 5.0000 mg | ORAL_TABLET | Freq: Every day | ORAL | 3 refills | Status: DC

## 2024-01-28 MED ORDER — COLCHICINE 0.6 MG PO TABS: 0.6000 mg | ORAL_TABLET | Freq: Two times a day (BID) | ORAL | 1 refills | Status: DC

## 2024-01-28 MED ORDER — FLUTICASONE PROPIONATE 50 MCG/ACT NA SUSP
1.0000 | Freq: Every day | NASAL | 1 refills | Status: AC
Start: 2024-01-28 — End: ?

## 2024-01-28 NOTE — Progress Notes (Signed)
 Established Patient Office Visit  Subjective:  Patient ID: Linda Roy, female    DOB: 1951/02/02  Age: 73 y.o. MRN: [id number]  Chief Complaint  Patient presents with   Follow-up    Patient is here today for follow up.Not in since 03/2023- has been travelling and ran out of meds  recently . She lives with her daughter. She reports she is doing well and has no new complaints. She reports being out of all her medications for the last week. BP is elevated today. Will send in refills for her medications. She also reports her joints have been hurting since she ran out of her medications; patient has gouty arthritis.  Her mammogram and DEXA scan are past due. Will send order to be completed. Last Colonoscopy was in 2016. Says she had a repeat one about 4 years ago- records not in chart - will ask for them. If not done then will schedule .  Patient declines wanting flu shot today.    No other concerns at this time.   Past Medical History:  Diagnosis Date   Allergic rhinitis    Arthritis    lumbar back mild degenerative changes   Gout    Hyperlipidemia    Hypertension    Insomnia    Shortness of breath dyspnea     Past Surgical History:  Procedure Laterality Date   ABDOMINAL HYSTERECTOMY     COLONOSCOPY WITH PROPOFOL  N/A 11/02/2014   Procedure: COLONOSCOPY WITH PROPOFOL ;  Surgeon: Deward CINDERELLA Piedmont, MD;  Location: [facility] ENDOSCOPY;  Service: Gastroenterology;  Laterality: N/A;    Social History   Socioeconomic History   Marital status: Single    Spouse name: Not on file   Number of children: Not on file   Years of education: Not on file   Highest education level: Not on file  Occupational History   Not on file  Tobacco Use   Smoking status: Never   Smokeless tobacco: Never  Substance and Sexual Activity   Alcohol use: Not on file   Drug use: Not on file   Sexual activity: Not on file  Other Topics Concern   Not on file  Social History Narrative   Not on file    Social Drivers of Health   Financial Resource Strain: Low Risk  (11/11/2020)   Received from [facility]   Overall Financial Resource Strain (CARDIA)    Difficulty of Paying Living Expenses: Not hard at all  Food Insecurity: No Food Insecurity (11/11/2020)   Received from [facility]   Hunger Vital Sign    Within the past 12 months, you worried that your food would run out before you got the money to buy more.: Never true    Within the past 12 months, the food you bought just didn't last and you didn't have money to get more.: Never true  Transportation Needs: No Transportation Needs (11/11/2020)   Received from [facility]   PRAPARE - Transportation    Lack of Transportation (Medical): No    Lack of Transportation (Non-Medical): No  Physical Activity: Not on file  Stress: Not on file  Social Connections: Not on file  Intimate Partner Violence: Not on file    Family History  Problem Relation Age of Onset   Breast cancer Neg Hx     Allergies  Allergen Reactions   Penicillins Nausea Only and Shortness Of Breath    Other reaction(s): Nausea, Vomiting, Shortness of Breath / Dyspnea  Lisinopril      Note: cough    Outpatient Medications Prior to Visit  Medication Sig   allopurinol  (ZYLOPRIM ) 300 MG tablet Take 1 tablet (300 mg total) by mouth daily.   amLODipine (NORVASC) 5 MG tablet TAKE 1 TABLET BY MOUTH EVERY DAY   diclofenac Sodium (VOLTAREN) 1 % GEL USE AS DIRECTED TWICE A DAY USE ON AFFECTED AREA   baclofen (LIORESAL) 10 MG tablet Take 10 mg by mouth 2 (two) times daily. (Patient not taking: Reported on 01/28/2024)   cetirizine (ZYRTEC) 10 MG tablet Take 10 mg by mouth daily. (Patient not taking: Reported on 01/28/2024)   Cholecalciferol 1.25 MG (50000 UT) capsule Take 50,000 Units by mouth daily. (Patient not taking: Reported on 01/28/2024)   colchicine  0.6 MG tablet TAKE 1 TABLET BY MOUTH 2 TIMES DAILY. (Patient not taking: Reported on 01/28/2024)    famotidine (PEPCID) 40 MG tablet TAKE 1 TABLET BY MOUTH EVERY DAY (Patient not taking: Reported on 01/28/2024)   fenofibrate  (TRICOR ) 145 MG tablet Take 1 tablet (145 mg total) by mouth daily. (Patient not taking: Reported on 01/28/2024)   fluticasone  (FLONASE ) 50 MCG/ACT nasal spray SPRAY 2 SPRAYS INTO EACH NOSTRIL EVERY DAY (Patient not taking: Reported on 01/28/2024)   gabapentin (NEURONTIN) 100 MG capsule Take 100 mg by mouth at bedtime. (Patient not taking: Reported on 01/28/2024)   lisinopril  (ZESTRIL ) 10 MG tablet Take 1 tablet (10 mg total) by mouth daily. (Patient not taking: Reported on 01/28/2024)   meloxicam (MOBIC) 7.5 MG tablet TAKE 1 TABLET BY MOUTH EVERY DAY (Patient not taking: Reported on 01/28/2024)   mometasone  (ELOCON ) 0.1 % lotion Apply topically daily. (Patient not taking: Reported on 01/28/2024)   SYMBICORT  160-4.5 MCG/ACT inhaler INHALE 2 PUFFS INTO THE LUNGS TWICE A DAY (Patient not taking: Reported on 01/28/2024)   No facility-administered medications prior to visit.    Review of Systems  Constitutional: Negative.  Negative for chills, fever and malaise/fatigue.  HENT: Negative.  Negative for congestion and sore throat.   Eyes: Negative.  Negative for blurred vision and pain.  Respiratory: Negative.  Negative for cough and shortness of breath.   Cardiovascular: Negative.  Negative for chest pain, palpitations and leg swelling.  Gastrointestinal: Negative.  Negative for abdominal pain, blood in stool, constipation, diarrhea, heartburn, melena, nausea and vomiting.  Genitourinary: Negative.  Negative for dysuria, flank pain, frequency and urgency.  Musculoskeletal:  Positive for joint pain. Negative for myalgias.  Skin: Negative.   Neurological: Negative.  Negative for dizziness, tingling, sensory change, weakness and headaches.  Endo/Heme/Allergies: Negative.   Psychiatric/Behavioral: Negative.  Negative for depression and suicidal ideas. The patient is not  nervous/anxious.        Objective:   BP (!) 162/78   Pulse 71   Ht 5' 3 (1.6 m)   Wt 145 lb 3.2 oz (65.9 kg)   SpO2 98%   BMI 25.72 kg/m   Vitals:   01/28/24 1328  BP: (!) 162/78  Pulse: 71  Height: 5' 3 (1.6 m)  Weight: 145 lb 3.2 oz (65.9 kg)  SpO2: 98%  BMI (Calculated): 25.73    Physical Exam Vitals and nursing note reviewed.  Constitutional:      Appearance: Normal appearance.  HENT:     Head: Normocephalic and atraumatic.     Nose: Nose normal.     Mouth/Throat:     Mouth: Mucous membranes are moist.     Pharynx: Oropharynx is clear.  Eyes:     Conjunctiva/sclera:  Conjunctivae normal.     Pupils: Pupils are equal, round, and reactive to light.  Cardiovascular:     Rate and Rhythm: Normal rate and regular rhythm.     Pulses: Normal pulses.     Heart sounds: Normal heart sounds. No murmur heard. Pulmonary:     Effort: Pulmonary effort is normal.     Breath sounds: Normal breath sounds. No wheezing.  Abdominal:     General: Bowel sounds are normal.     Palpations: Abdomen is soft.     Tenderness: There is no abdominal tenderness. There is no right CVA tenderness or left CVA tenderness.  Musculoskeletal:        General: Normal range of motion.     Cervical back: Normal range of motion.     Right lower leg: No edema.     Left lower leg: No edema.  Skin:    General: Skin is warm and dry.  Neurological:     General: No focal deficit present.     Mental Status: She is alert and oriented to person, place, and time.  Psychiatric:        Mood and Affect: Mood normal.        Behavior: Behavior normal.      No results found for any visits on 01/28/24.  No results found for this or any previous visit (from the past 2160 hours).    Assessment & Plan:  Refill medications. Will check routine fasting blood work today and FU with patient on results. Mammogram and DEXA scan ordered. Problem List Items Addressed This Visit     Hypertriglyceridemia    Essential hypertension, benign - Primary   Relevant Orders   CMP14+EGFR   Osteoarthritis   Prediabetes   Relevant Orders   Hemoglobin A1c   Seasonal allergic rhinitis due to pollen   Mixed hyperlipidemia   Relevant Orders   Lipid Panel w/o Chol/HDL Ratio   Chronic gout of multiple sites   Relevant Orders   Uric acid   CBC with Diff   Breast cancer screening by mammogram   Relevant Orders   MM 3D SCREENING MAMMOGRAM BILATERAL BREAST   Screening for osteoporosis   Relevant Orders   HM DEXA SCAN (Completed)   Gastroesophageal reflux disease without esophagitis    Return in about 10 days (around 02/07/2024).   Total time spent: 30 minutes  FERNAND FREDY RAMAN, MD  01/28/2024   This document may have been prepared by [facility] Voice Recognition software and as such may include unintentional dictation errors.

## 2024-01-29 ENCOUNTER — Ambulatory Visit: Payer: Self-pay | Admitting: Internal Medicine

## 2024-01-29 LAB — LIPID PANEL W/O CHOL/HDL RATIO
Cholesterol, Total: 186 mg/dL (ref 100–199)
HDL: 65 mg/dL (ref >39)
LDL Chol Calc (NIH): 83 mg/dL (ref 0–99)
Triglycerides: 229 mg/dL — ABNORMAL HIGH (ref 0–149)
VLDL Cholesterol Cal: 38 mg/dL (ref 5–40)

## 2024-01-29 LAB — CMP14+EGFR
ALT: 13 IU/L (ref 0–32)
AST: 17 IU/L (ref 0–40)
Albumin: 4.4 g/dL (ref 3.8–4.8)
Alkaline Phosphatase: 118 IU/L (ref 49–135)
BUN/Creatinine Ratio: 20 (ref 12–28)
BUN: 15 mg/dL (ref 8–27)
Bilirubin Total: 0.5 mg/dL (ref 0.0–1.2)
CO2: 25 mmol/L (ref 20–29)
Calcium: 9.6 mg/dL (ref 8.7–10.3)
Chloride: 103 mmol/L (ref 96–106)
Creatinine, Ser: 0.76 mg/dL (ref 0.57–1.00)
Globulin, Total: 2.7 g/dL (ref 1.5–4.5)
Glucose: 87 mg/dL (ref 70–99)
Potassium: 4.2 mmol/L (ref 3.5–5.2)
Sodium: 139 mmol/L (ref 134–144)
Total Protein: 7.1 g/dL (ref 6.0–8.5)
eGFR: 82 mL/min/1.73 (ref >59)

## 2024-01-29 LAB — CBC WITH DIFFERENTIAL/PLATELET
Basophils Absolute: 0.1 x10E3/uL (ref 0.0–0.2)
Basos: 1 %
EOS (ABSOLUTE): 0.3 x10E3/uL (ref 0.0–0.4)
Eos: 4 %
Hematocrit: 40.5 % (ref 34.0–46.6)
Hemoglobin: 13.6 g/dL (ref 11.1–15.9)
Immature Grans (Abs): 0 x10E3/uL (ref 0.0–0.1)
Immature Granulocytes: 0 %
Lymphocytes Absolute: 2.8 x10E3/uL (ref 0.7–3.1)
Lymphs: 36 %
MCH: 30.6 pg (ref 26.6–33.0)
MCHC: 33.6 g/dL (ref 31.5–35.7)
MCV: 91 fL (ref 79–97)
Monocytes Absolute: 0.5 x10E3/uL (ref 0.1–0.9)
Monocytes: 6 %
Neutrophils Absolute: 4.1 x10E3/uL (ref 1.4–7.0)
Neutrophils: 53 %
Platelets: 224 x10E3/uL (ref 150–450)
RBC: 4.45 x10E6/uL (ref 3.77–5.28)
RDW: 12.6 % (ref 11.7–15.4)
WBC: 7.6 x10E3/uL (ref 3.4–10.8)

## 2024-01-29 LAB — URIC ACID: Uric Acid: 7.2 mg/dL (ref 3.1–7.9)

## 2024-01-29 LAB — HEMOGLOBIN A1C
Est. average glucose Bld gHb Est-mCnc: 111 mg/dL
Hgb A1c MFr Bld: 5.5 % (ref 4.8–5.6)

## 2024-02-05 NOTE — Progress Notes (Signed)
 Left message to return call

## 2024-02-07 ENCOUNTER — Encounter: Payer: Self-pay | Admitting: Internal Medicine

## 2024-02-07 ENCOUNTER — Ambulatory Visit: Admitting: Internal Medicine

## 2024-02-07 VITALS — BP 138/68 | HR 71 | Ht 63.0 in | Wt 144.8 lb

## 2024-02-07 DIAGNOSIS — I10 Essential (primary) hypertension: Secondary | ICD-10-CM

## 2024-02-07 DIAGNOSIS — M159 Polyosteoarthritis, unspecified: Secondary | ICD-10-CM

## 2024-02-07 DIAGNOSIS — E781 Pure hyperglyceridemia: Secondary | ICD-10-CM

## 2024-02-07 DIAGNOSIS — K219 Gastro-esophageal reflux disease without esophagitis: Secondary | ICD-10-CM

## 2024-02-07 DIAGNOSIS — M1A09X0 Idiopathic chronic gout, multiple sites, without tophus (tophi): Secondary | ICD-10-CM | POA: Diagnosis not present

## 2024-02-07 DIAGNOSIS — E782 Mixed hyperlipidemia: Secondary | ICD-10-CM | POA: Diagnosis not present

## 2024-02-07 DIAGNOSIS — R7303 Prediabetes: Secondary | ICD-10-CM

## 2024-02-07 DIAGNOSIS — J301 Allergic rhinitis due to pollen: Secondary | ICD-10-CM

## 2024-02-07 MED ORDER — FLONASE SENSIMIST 27.5 MCG/SPRAY NA SUSP: 2.0000 | Freq: Every day | NASAL | 12 refills | Status: AC

## 2024-02-07 MED ORDER — CIPRO HC 0.2-1 % OT SUSP: 4.0000 [drp] | Freq: Two times a day (BID) | OTIC | 0 refills | Status: DC

## 2024-02-07 MED ORDER — MELOXICAM 7.5 MG PO TABS: 7.5000 mg | ORAL_TABLET | Freq: Every day | ORAL | 3 refills | Status: AC

## 2024-02-07 MED ORDER — COLCHICINE 0.6 MG PO TABS
0.6000 mg | ORAL_TABLET | Freq: Two times a day (BID) | ORAL | 1 refills | Status: AC
Start: 2024-02-07 — End: ?

## 2024-02-07 MED ORDER — FAMOTIDINE 40 MG PO TABS
40.0000 mg | ORAL_TABLET | Freq: Every day | ORAL | 3 refills | Status: AC
Start: 2024-02-07 — End: ?

## 2024-02-07 MED ORDER — AMLODIPINE BESYLATE 5 MG PO TABS: 5.0000 mg | ORAL_TABLET | Freq: Every day | ORAL | 3 refills | Status: AC

## 2024-02-07 MED ORDER — ALLOPURINOL 300 MG PO TABS: 300.0000 mg | ORAL_TABLET | Freq: Every day | ORAL | 3 refills | Status: AC

## 2024-02-07 MED ORDER — LORATADINE 10 MG PO TABS: 10.0000 mg | ORAL_TABLET | Freq: Every day | ORAL | 2 refills | Status: AC

## 2024-02-07 NOTE — Progress Notes (Signed)
 Established Patient Office Visit  Subjective:  Patient ID: Linda Roy, female    DOB: 27-Sep-1950  Age: 73 y.o. MRN: [id number]  Chief Complaint  Patient presents with   Follow-up    10 day follow up    Patient is here today for follow up. She reports improvement in her joint pain and BP since restarting her medications.   She has complaints today of bilateral ear itching. Reports its been going on for approximately 7 months. She saw a provider about it when [place] was [place] and they recommended her to do cortisone 10 to external ear canal daily but it has not helped. Recommended patient to take Claritin daily and add Flonase  nasal spray daily for allergy relief.  Discussed patients labs in detail and reinforced healthy diet and exercise as tolerated. Denies chest pain, shortness of breath, abdominal pain, nausea, vomiting, diarrhea, constipation at this time.  Patient does not want a flu shot at this time.    No other concerns at this time.   Past Medical History:  Diagnosis Date   Allergic rhinitis    Arthritis    lumbar back mild degenerative changes   Gout    Hyperlipidemia    Hypertension    Insomnia    Shortness of breath dyspnea     Past Surgical History:  Procedure Laterality Date   ABDOMINAL HYSTERECTOMY     COLONOSCOPY WITH PROPOFOL  N/A 11/02/2014   Procedure: COLONOSCOPY WITH PROPOFOL ;  Surgeon: Deward CINDERELLA Piedmont, MD;  Location: [facility] ENDOSCOPY;  Service: Gastroenterology;  Laterality: N/A;    Social History   Socioeconomic History   Marital status: Single    Spouse name: Not on file   Number of children: Not on file   Years of education: Not on file   Highest education level: Not on file  Occupational History   Not on file  Tobacco Use   Smoking status: Never   Smokeless tobacco: Never  Substance and Sexual Activity   Alcohol use: Not on file   Drug use: Not on file   Sexual activity: Not on file  Other Topics Concern   Not on file   Social History Narrative   Not on file   Social Drivers of Health   Financial Resource Strain: Low Risk  (11/11/2020)   Received from [facility]   Overall Financial Resource Strain (CARDIA)    Difficulty of Paying Living Expenses: Not hard at all  Food Insecurity: No Food Insecurity (11/11/2020)   Received from [facility]   Hunger Vital Sign    Within the past 12 months, you worried that your food would run out before you got the money to buy more.: Never true    Within the past 12 months, the food you bought just didn't last and you didn't have money to get more.: Never true  Transportation Needs: No Transportation Needs (11/11/2020)   Received from [facility]   PRAPARE - Transportation    Lack of Transportation (Medical): No    Lack of Transportation (Non-Medical): No  Physical Activity: Not on file  Stress: Not on file  Social Connections: Not on file  Intimate Partner Violence: Not on file    Family History  Problem Relation Age of Onset   Breast cancer Neg Hx     Allergies  Allergen Reactions   Penicillins Nausea Only and Shortness Of Breath    Other reaction(s): Nausea, Vomiting, Shortness of Breath / Dyspnea  Lisinopril      Note: cough    Outpatient Medications Prior to Visit  Medication Sig   baclofen (LIORESAL) 10 MG tablet Take 1 tablet (10 mg total) by mouth 2 (two) times daily as needed for muscle spasms.   cetirizine (ZYRTEC) 10 MG tablet Take 1 tablet (10 mg total) by mouth daily.   Cholecalciferol 1.25 MG (50000 UT) capsule Take 50,000 Units by mouth daily.   diclofenac Sodium (VOLTAREN) 1 % GEL USE AS DIRECTED TWICE A DAY USE ON AFFECTED AREA   fenofibrate  (TRICOR ) 145 MG tablet Take 1 tablet (145 mg total) by mouth daily.   fluticasone  (FLONASE ) 50 MCG/ACT nasal spray Place 1 spray into both nostrils daily.   gabapentin (NEURONTIN) 100 MG capsule Take 1 capsule (100 mg total) by mouth at bedtime.   [DISCONTINUED] allopurinol  (ZYLOPRIM )  300 MG tablet Take 1 tablet (300 mg total) by mouth daily.   [DISCONTINUED] amLODipine (NORVASC) 5 MG tablet Take 1 tablet (5 mg total) by mouth daily.   [DISCONTINUED] colchicine  0.6 MG tablet Take 1 tablet (0.6 mg total) by mouth 2 (two) times daily.   [DISCONTINUED] famotidine (PEPCID) 40 MG tablet Take 1 tablet (40 mg total) by mouth daily.   [DISCONTINUED] meloxicam (MOBIC) 7.5 MG tablet Take 1 tablet (7.5 mg total) by mouth daily.   lisinopril  (ZESTRIL ) 10 MG tablet Take 1 tablet (10 mg total) by mouth daily. (Patient not taking: Reported on 02/07/2024)   mometasone  (ELOCON ) 0.1 % lotion Apply topically daily. (Patient not taking: Reported on 02/07/2024)   SYMBICORT  160-4.5 MCG/ACT inhaler INHALE 2 PUFFS INTO THE LUNGS TWICE A DAY (Patient not taking: Reported on 02/07/2024)   No facility-administered medications prior to visit.    Review of Systems  Constitutional: Negative.  Negative for chills, fever and malaise/fatigue.  HENT:  Positive for tinnitus. Negative for congestion and sore throat.   Eyes: Negative.  Negative for blurred vision and pain.  Respiratory: Negative.  Negative for cough and shortness of breath.   Cardiovascular: Negative.  Negative for chest pain, palpitations and leg swelling.  Gastrointestinal: Negative.  Negative for abdominal pain, blood in stool, constipation, diarrhea, heartburn, melena, nausea and vomiting.  Genitourinary: Negative.  Negative for dysuria, flank pain, frequency and urgency.  Musculoskeletal: Negative.  Negative for joint pain and myalgias.  Skin: Negative.   Neurological: Negative.  Negative for dizziness, tingling, sensory change, weakness and headaches.  Endo/Heme/Allergies: Negative.   Psychiatric/Behavioral: Negative.  Negative for depression and suicidal ideas. The patient is not nervous/anxious.        Objective:   BP 138/68   Pulse 71   Ht 5' 3 (1.6 m)   Wt 144 lb 12.8 oz (65.7 kg)   SpO2 97%   BMI 25.65 kg/m    Vitals:   02/07/24 1416  BP: 138/68  Pulse: 71  Height: 5' 3 (1.6 m)  Weight: 144 lb 12.8 oz (65.7 kg)  SpO2: 97%  BMI (Calculated): 25.66    Physical Exam Vitals and nursing note reviewed.  Constitutional:      Appearance: Normal appearance.  HENT:     Head: Normocephalic and atraumatic.     Right Ear: Hearing normal. A middle ear effusion is present. Tympanic membrane is bulging.     Left Ear: Hearing normal. A middle ear effusion is present. Tympanic membrane is bulging.     Nose: Nose normal.     Mouth/Throat:     Mouth: Mucous membranes are moist.     Pharynx:  Oropharynx is clear.  Eyes:     Conjunctiva/sclera: Conjunctivae normal.     Pupils: Pupils are equal, round, and reactive to light.  Cardiovascular:     Rate and Rhythm: Normal rate and regular rhythm.     Pulses: Normal pulses.     Heart sounds: Normal heart sounds. No murmur heard. Pulmonary:     Effort: Pulmonary effort is normal.     Breath sounds: Normal breath sounds. No wheezing.  Abdominal:     General: Bowel sounds are normal.     Palpations: Abdomen is soft.     Tenderness: There is no abdominal tenderness. There is no right CVA tenderness or left CVA tenderness.  Musculoskeletal:        General: Normal range of motion.     Cervical back: Normal range of motion.     Right lower leg: No edema.     Left lower leg: No edema.  Skin:    General: Skin is warm and dry.  Neurological:     General: No focal deficit present.     Mental Status: She is alert and oriented to person, place, and time.  Psychiatric:        Mood and Affect: Mood normal.        Behavior: Behavior normal.      No results found for any visits on 02/07/24.  Recent Results (from the past 2160 hours)  CMP14+EGFR     Status: None   Collection Time: 01/28/24  2:17 PM  Result Value Ref Range   Glucose 87 70 - 99 mg/dL   BUN 15 8 - 27 mg/dL   Creatinine, Ser 9.23 0.57 - 1.00 mg/dL   eGFR 82 >40 fO/fpw/8.26    BUN/Creatinine Ratio 20 12 - 28   Sodium 139 134 - 144 mmol/L   Potassium 4.2 3.5 - 5.2 mmol/L   Chloride 103 96 - 106 mmol/L   CO2 25 20 - 29 mmol/L   Calcium 9.6 8.7 - 10.3 mg/dL   Total Protein 7.1 6.0 - 8.5 g/dL   Albumin 4.4 3.8 - 4.8 g/dL   Globulin, Total 2.7 1.5 - 4.5 g/dL   Bilirubin Total 0.5 0.0 - 1.2 mg/dL   Alkaline Phosphatase 118 49 - 135 IU/L   AST 17 0 - 40 IU/L   ALT 13 0 - 32 IU/L  Uric acid     Status: None   Collection Time: 01/28/24  2:17 PM  Result Value Ref Range   Uric Acid 7.2 3.1 - 7.9 mg/dL    Comment:            Therapeutic target for gout patients: <6.0  Lipid Panel w/o Chol/HDL Ratio     Status: Abnormal   Collection Time: 01/28/24  2:17 PM  Result Value Ref Range   Cholesterol, Total 186 100 - 199 mg/dL   Triglycerides 770 (H) 0 - 149 mg/dL   HDL 65 >60 mg/dL   VLDL Cholesterol Cal 38 5 - 40 mg/dL   LDL Chol Calc (NIH) 83 0 - 99 mg/dL  CBC with Diff     Status: None   Collection Time: 01/28/24  2:17 PM  Result Value Ref Range   WBC 7.6 3.4 - 10.8 x10E3/uL   RBC 4.45 3.77 - 5.28 x10E6/uL   Hemoglobin 13.6 11.1 - 15.9 g/dL   Hematocrit 59.4 65.9 - 46.6 %   MCV 91 79 - 97 fL   MCH 30.6 26.6 - 33.0 pg   MCHC  33.6 31.5 - 35.7 g/dL   RDW 87.3 88.2 - 84.5 %   Platelets 224 150 - 450 x10E3/uL   Neutrophils 53 Not Estab. %   Lymphs 36 Not Estab. %   Monocytes 6 Not Estab. %   Eos 4 Not Estab. %   Basos 1 Not Estab. %   Neutrophils Absolute 4.1 1.4 - 7.0 x10E3/uL   Lymphocytes Absolute 2.8 0.7 - 3.1 x10E3/uL   Monocytes Absolute 0.5 0.1 - 0.9 x10E3/uL   EOS (ABSOLUTE) 0.3 0.0 - 0.4 x10E3/uL   Basophils Absolute 0.1 0.0 - 0.2 x10E3/uL   Immature Granulocytes 0 Not Estab. %   Immature Grans (Abs) 0.0 0.0 - 0.1 x10E3/uL  Hemoglobin A1c     Status: None   Collection Time: 01/28/24  2:17 PM  Result Value Ref Range   Hgb A1c MFr Bld 5.5 4.8 - 5.6 %    Comment:          Prediabetes: 5.7 - 6.4          Diabetes: >6.4          Glycemic control for  adults with diabetes: <7.0    Est. average glucose Bld gHb Est-mCnc 111 mg/dL      Assessment & Plan:  Continue medications as prescribed. Start Claritin daily and Flonase  nasal spray daily. Reinforced healthy diet and exercise as tolerated. Discussed low purine diet with patient. Problem List Items Addressed This Visit     Hypertriglyceridemia   Relevant Medications   amLODipine (NORVASC) 5 MG tablet   Essential hypertension, benign - Primary   Relevant Medications   amLODipine (NORVASC) 5 MG tablet   Osteoarthritis   Relevant Medications   colchicine  0.6 MG tablet   meloxicam (MOBIC) 7.5 MG tablet   allopurinol  (ZYLOPRIM ) 300 MG tablet   Prediabetes   Seasonal allergic rhinitis due to pollen   Relevant Medications   loratadine (CLARITIN) 10 MG tablet   fluticasone  (FLONASE  SENSIMIST) 27.5 MCG/SPRAY nasal spray   ciprofloxacin -hydrocortisone (CIPRO  HC) OTIC suspension   Mixed hyperlipidemia   Relevant Medications   amLODipine (NORVASC) 5 MG tablet   Chronic gout of multiple sites   Relevant Medications   colchicine  0.6 MG tablet   meloxicam (MOBIC) 7.5 MG tablet   allopurinol  (ZYLOPRIM ) 300 MG tablet   Gastroesophageal reflux disease without esophagitis   Relevant Medications   famotidine (PEPCID) 40 MG tablet    Return in about 2 weeks (around 02/21/2024).   Total time spent: 25 minutes. This time includes review of previous notes and results and patient face to face interaction during today's visit.    FERNAND FREDY RAMAN, MD  02/07/2024   This document may have been prepared by [facility] Voice Recognition software and as such may include unintentional dictation errors.

## 2024-02-08 ENCOUNTER — Telehealth: Payer: Self-pay

## 2024-02-08 ENCOUNTER — Other Ambulatory Visit: Payer: Self-pay | Admitting: Internal Medicine

## 2024-02-08 DIAGNOSIS — H60313 Diffuse otitis externa, bilateral: Secondary | ICD-10-CM

## 2024-02-08 MED ORDER — NEOMYCIN-POLYMYXIN-HC 3.5-10000-1 OT SOLN: 3.0000 [drp] | Freq: Three times a day (TID) | OTIC | 0 refills | Status: AC

## 2024-02-08 NOTE — Telephone Encounter (Signed)
 The cipro  ear drop you sent in for the patient is not covered can you send in an alternative

## 2024-05-09 ENCOUNTER — Ambulatory Visit
